# Patient Record
Sex: Female | Born: 1950 | Race: White | Hispanic: No | Marital: Married | Smoking: Former smoker
Health system: Southern US, Community
[De-identification: ages and names within clinical notes are randomized; demographics above are authoritative.]

## PROBLEM LIST (undated history)

## (undated) DIAGNOSIS — C439 Malignant melanoma of skin, unspecified: Secondary | ICD-10-CM

## (undated) DIAGNOSIS — I10 Essential (primary) hypertension: Secondary | ICD-10-CM

## (undated) DIAGNOSIS — E079 Disorder of thyroid, unspecified: Secondary | ICD-10-CM

## (undated) DIAGNOSIS — E785 Hyperlipidemia, unspecified: Secondary | ICD-10-CM

## (undated) HISTORY — DX: Hyperlipidemia, unspecified: E78.5

## (undated) HISTORY — DX: Essential (primary) hypertension: I10

## (undated) HISTORY — DX: Disorder of thyroid, unspecified: E07.9

## (undated) HISTORY — DX: Malignant melanoma of skin, unspecified: C43.9

## (undated) HISTORY — PX: SKIN LESION EXCISION: SHX2412

## (undated) HISTORY — PX: ABDOMINAL HYSTERECTOMY: SHX81

---

## 2006-11-02 ENCOUNTER — Ambulatory Visit: Payer: Self-pay | Admitting: Gastroenterology

## 2012-10-25 ENCOUNTER — Telehealth: Payer: Self-pay | Admitting: Nurse Practitioner

## 2012-10-25 MED ORDER — ATORVASTATIN CALCIUM 40 MG PO TABS: 40.0000 mg | ORAL_TABLET | Freq: Every day | ORAL | Status: DC

## 2012-10-25 MED ORDER — AMLODIPINE BESYLATE 5 MG PO TABS: 5.0000 mg | ORAL_TABLET | Freq: Every day | ORAL | Status: DC

## 2012-10-25 NOTE — Telephone Encounter (Signed)
rx sent in 

## 2012-10-25 NOTE — Telephone Encounter (Signed)
Lipiotr and norvasc come in Generic. RX sent to [facility]

## 2012-10-25 NOTE — Telephone Encounter (Signed)
Please advise 

## 2012-11-26 ENCOUNTER — Encounter: Payer: Self-pay | Admitting: Nurse Practitioner

## 2012-11-26 ENCOUNTER — Ambulatory Visit (INDEPENDENT_AMBULATORY_CARE_PROVIDER_SITE_OTHER): Payer: BC Managed Care – PPO | Admitting: Nurse Practitioner

## 2012-11-26 VITALS — BP 140/78 | HR 87 | Temp 98.0°F | Ht 64.5 in | Wt 157.0 lb

## 2012-11-26 DIAGNOSIS — E785 Hyperlipidemia, unspecified: Secondary | ICD-10-CM | POA: Insufficient documentation

## 2012-11-26 DIAGNOSIS — Z1211 Encounter for screening for malignant neoplasm of colon: Secondary | ICD-10-CM

## 2012-11-26 DIAGNOSIS — I10 Essential (primary) hypertension: Secondary | ICD-10-CM

## 2012-11-26 DIAGNOSIS — E039 Hypothyroidism, unspecified: Secondary | ICD-10-CM

## 2012-11-26 DIAGNOSIS — L989 Disorder of the skin and subcutaneous tissue, unspecified: Secondary | ICD-10-CM

## 2012-11-26 MED ORDER — BENAZEPRIL HCL 20 MG PO TABS: 20.0000 mg | ORAL_TABLET | Freq: Every day | ORAL | Status: DC

## 2012-11-26 MED ORDER — LEVOTHYROXINE SODIUM 88 MCG PO TABS: 88.0000 ug | ORAL_TABLET | Freq: Every day | ORAL | Status: DC

## 2012-11-26 NOTE — Patient Instructions (Signed)

## 2012-11-26 NOTE — Progress Notes (Signed)
Subjective:    Patient ID: Gabriela Gardner, female    DOB: 11-19-1950, 62 y.o.   MRN: [id number]  Hypertension This is a chronic problem. The current episode started more than 1 year ago. The problem is unchanged. The problem is controlled (120'2 systolic when takes at home.). Pertinent negatives include no blurred vision, chest pain, headaches, malaise/fatigue, peripheral edema or shortness of breath. There are no associated agents to hypertension. Risk factors for coronary artery disease include dyslipidemia and post-menopausal state. Past treatments include ACE inhibitors and calcium channel blockers. The current treatment provides moderate improvement. Compliance problems include diet and exercise.  Hypertensive end-organ damage includes a thyroid problem.  Hyperlipidemia This is a chronic problem. The current episode started more than 1 year ago. The problem is controlled. Recent lipid tests were reviewed and are normal. Exacerbating diseases include hypothyroidism. She has no history of diabetes. Pertinent negatives include no chest pain or shortness of breath. Current antihyperlipidemic treatment includes statins. The current treatment provides significant improvement of lipids. There are no compliance problems.  Risk factors for coronary artery disease include hypertension and post-menopausal.  Thyroid Problem Presents for follow-up visit. Patient reports no anxiety, constipation, diaphoresis, diarrhea, dry skin, hair loss, heat intolerance, tremors, weight gain or weight loss. The symptoms have been stable. Her past medical history is significant for hyperlipidemia. There is no history of diabetes.      Review of Systems  Constitutional: Negative for weight loss, weight gain, malaise/fatigue and diaphoresis.  Eyes: Negative for blurred vision.  Respiratory: Negative for shortness of breath.   Cardiovascular: Negative for chest pain.  Gastrointestinal: Negative for diarrhea and constipation.   Endocrine: Negative for heat intolerance.  Neurological: Negative for tremors and headaches.  All other systems reviewed and are negative.       Objective:   Physical Exam  Constitutional: She is oriented to person, place, and time. She appears well-developed and well-nourished.  HENT:  Nose: Nose normal.  Mouth/Throat: Oropharynx is clear and moist.  Eyes: EOM are normal.  Neck: Trachea normal, normal range of motion and full passive range of motion without pain. Neck supple. No JVD present. Carotid bruit is not present. No thyromegaly present.  Cardiovascular: Normal rate, regular rhythm, normal heart sounds and intact distal pulses.  Exam reveals no gallop and no friction rub.   No murmur heard. Pulmonary/Chest: Effort normal and breath sounds normal.  Abdominal: Soft. Bowel sounds are normal. She exhibits no distension and no mass. There is no tenderness.  Musculoskeletal: Normal range of motion.  Lymphadenopathy:    She has no cervical adenopathy.  Neurological: She is alert and oriented to person, place, and time. She has normal reflexes.  Skin: Skin is warm and dry.  Psychiatric: She has a normal mood and affect. Her behavior is normal. Judgment and thought content normal.   BP 140/78  Pulse 87  Temp(Src) 98 F (36.7 C) (Oral)  Ht 5' 4.5" (1.638 m)  Wt 157 lb (71.215 kg)  BMI 26.54 kg/m2        Assessment & Plan:   1. Hypertension   2. Hyperlipidemia   3. Hypothyroidism    Orders Placed This Encounter  Procedures  . COMPLETE METABOLIC PANEL WITH GFR  . NMR Lipoprofile with Lipids  . Thyroid Panel With TSH     Medication List       These changes are accurate as of: 11/26/2012  3:36 PM. If you have any questions, ask your nurse or doctor.  TAKE these medications       amLODipine 5 MG tablet  Commonly known as:  NORVASC  Take 1 tablet (5 mg total) by mouth daily.     atorvastatin 40 MG tablet  Commonly known as:  LIPITOR  Take 1 tablet  (40 mg total) by mouth daily.     benazepril 20 MG tablet  Commonly known as:  LOTENSIN  Take 1 tablet (20 mg total) by mouth daily.     levothyroxine 88 MCG tablet  Commonly known as:  SYNTHROID, LEVOTHROID  Take 1 tablet (88 mcg total) by mouth daily before breakfast.       Continue all meds  Labs pending Diet and exercise encouraged Referral for colonoscopy Schedule for mammogram  Mary-Margaret Daphine Deutscher, FNP

## 2012-11-27 LAB — COMPLETE METABOLIC PANEL WITH GFR
CO2: 27 mEq/L (ref 19–32)
Creat: 0.97 mg/dL (ref 0.50–1.10)
GFR, Est African American: 72 mL/min
GFR, Est Non African American: 62 mL/min
Glucose, Bld: 99 mg/dL (ref 70–99)
Sodium: 140 mEq/L (ref 135–145)
Total Bilirubin: 0.6 mg/dL (ref 0.3–1.2)
Total Protein: 7.3 g/dL (ref 6.0–8.3)

## 2012-11-27 LAB — THYROID PANEL WITH TSH: T4, Total: 6.3 ug/dL (ref 5.0–12.5)

## 2012-11-30 ENCOUNTER — Encounter: Payer: Self-pay | Admitting: Gastroenterology

## 2012-11-30 LAB — NMR LIPOPROFILE WITH LIPIDS
HDL Size: 9.2 nm (ref >=9.2)
HDL-C: 60 mg/dL (ref >=40)
LDL Size: 20.9 nm (ref >20.5)
Large HDL-P: 8.9 umol/L (ref >=4.8)

## 2012-12-02 ENCOUNTER — Encounter: Payer: Self-pay | Admitting: Nurse Practitioner

## 2013-01-04 ENCOUNTER — Ambulatory Visit (AMBULATORY_SURGERY_CENTER): Payer: BC Managed Care – PPO

## 2013-01-04 ENCOUNTER — Encounter: Payer: Self-pay | Admitting: Gastroenterology

## 2013-01-04 VITALS — Ht 64.5 in | Wt 164.0 lb

## 2013-01-04 DIAGNOSIS — Z1211 Encounter for screening for malignant neoplasm of colon: Secondary | ICD-10-CM

## 2013-01-04 MED ORDER — NA SULFATE-K SULFATE-MG SULF 17.5-3.13-1.6 GM/177ML PO SOLN: 1.0000 | Freq: Once | ORAL | Status: DC

## 2013-01-18 ENCOUNTER — Ambulatory Visit (AMBULATORY_SURGERY_CENTER): Payer: BC Managed Care – PPO | Admitting: Gastroenterology

## 2013-01-18 ENCOUNTER — Encounter: Payer: Self-pay | Admitting: Gastroenterology

## 2013-01-18 VITALS — BP 113/68 | HR 59 | Temp 98.1°F | Resp 19 | Ht 64.0 in | Wt 164.0 lb

## 2013-01-18 DIAGNOSIS — Z1211 Encounter for screening for malignant neoplasm of colon: Secondary | ICD-10-CM

## 2013-01-18 MED ORDER — SODIUM CHLORIDE 0.9 % IV SOLN: 500.0000 mL | INTRAVENOUS | Status: DC

## 2013-01-18 NOTE — Patient Instructions (Signed)
YOU HAD AN ENDOSCOPIC PROCEDURE TODAY AT THE [facility]: Refer to the procedure report that was given to you for any specific questions about what was found during the examination.  If the procedure report does not answer your questions, please call your gastroenterologist to clarify.  If you requested that your care partner not be given the details of your procedure findings, then the procedure report has been included in a sealed envelope for you to review at your convenience later.  YOU SHOULD EXPECT: Some feelings of bloating in the abdomen. Passage of more gas than usual.  Walking can help get rid of the air that was put into your GI tract during the procedure and reduce the bloating. If you had a lower endoscopy (such as a colonoscopy or flexible sigmoidoscopy) you may notice spotting of blood in your stool or on the toilet paper. If you underwent a bowel prep for your procedure, then you may not have a normal bowel movement for a few days.  DIET: Your first meal following the procedure should be a light meal and then it is ok to progress to your normal diet.  A half-sandwich or bowl of soup is an example of a good first meal.  Heavy or fried foods are harder to digest and may make you feel nauseous or bloated.  Likewise meals heavy in dairy and vegetables can cause extra gas to form and this can also increase the bloating.  Drink plenty of fluids but you should avoid alcoholic beverages for 24 hours.  ACTIVITY: Your care partner should take you home directly after the procedure.  You should plan to take it easy, moving slowly for the rest of the day.  You can resume normal activity the day after the procedure however you should NOT DRIVE or use heavy machinery for 24 hours (because of the sedation medicines used during the test).    SYMPTOMS TO REPORT IMMEDIATELY: A gastroenterologist can be reached at any hour.  During normal business hours, 8:30 AM to 5:00 PM Monday through Friday,  call [phone].  After hours and on weekends, please call the GI answering service at [phone] who will take a message and have the physician on call contact you.   Following lower endoscopy (colonoscopy or flexible sigmoidoscopy):  Excessive amounts of blood in the stool  Significant tenderness or worsening of abdominal pains  Swelling of the abdomen that is new, acute  Fever of 100F or higher    FOLLOW UP: If any biopsies were taken you will be contacted by phone or by letter within the next 1-3 weeks.  Call your gastroenterologist if you have not heard about the biopsies in 3 weeks.  Our staff will call the home number listed on your records the next business day following your procedure to check on you and address any questions or concerns that you may have at that time regarding the information given to you following your procedure. This is a courtesy call and so if there is no answer at the home number and we have not heard from you through the emergency physician on call, we will assume that you have returned to your regular daily activities without incident.  SIGNATURES/CONFIDENTIALITY: You and/or your care partner have signed paperwork which will be entered into your electronic medical record.  These signatures attest to the fact that that the information above on your After Visit Summary has been reviewed and is understood.  Full responsibility of the confidentiality   of this discharge information lies with you and/or your care-partner.     

## 2013-01-18 NOTE — Progress Notes (Signed)
Patient did not have preoperative order for IV antibiotic SSI prophylaxis. (G8918)  Patient did not experience any of the following events: a burn prior to discharge; a fall within the facility; wrong site/side/patient/procedure/implant event; or a hospital transfer or hospital admission upon discharge from the facility. (G8907)  

## 2013-01-18 NOTE — Op Note (Signed)
[facility] [address] [place], [address]   COLONOSCOPY PROCEDURE REPORT  PATIENT: Gabriela, Gardner  MR#: [id number] BIRTHDATE: Dec 12, 1950 , 62  yrs. old GENDER: Female ENDOSCOPIST: Louis Meckel, MD REFERRED XB:JYNWGN Christell Constant, M.D. PROCEDURE DATE:  01/18/2013 PROCEDURE:   Colonoscopy, diagnostic ASA CLASS:   Class II INDICATIONS:average risk screening. MEDICATIONS: MAC sedation, administered by CRNA and propofol (Diprivan) 300mg  IV  DESCRIPTION OF PROCEDURE:   After the risks benefits and alternatives of the procedure were thoroughly explained, informed consent was obtained.  A digital rectal exam revealed no abnormalities of the rectum.   The LB [id number]  endoscope was introduced through the anus and advanced to the cecum, which was identified by both the appendix and ileocecal valve. No adverse events experienced.   The quality of the prep was excellent using Suprep  The instrument was then slowly withdrawn as the colon was fully examined.      COLON FINDINGS: A normal appearing cecum, ileocecal valve, and appendiceal orifice were identified.  The ascending, hepatic flexure, transverse, splenic flexure, descending, sigmoid colon and rectum appeared unremarkable.  No polyps or cancers were seen. Retroflexed views revealed no abnormalities. The time to cecum=4 minutes 0 seconds.  Withdrawal time=8 minutes 26 seconds.  The scope was withdrawn and the procedure completed. COMPLICATIONS: There were no complications.  ENDOSCOPIC IMPRESSION: Normal colon  RECOMMENDATIONS: Continue current colorectal screening recommendations for "routine risk" patients with a repeat colonoscopy in 10 years.   eSigned:  Louis Meckel, MD 01/18/2013 11:38 AM   cc:

## 2013-01-19 ENCOUNTER — Telehealth: Payer: Self-pay

## 2013-01-19 NOTE — Telephone Encounter (Signed)
  Follow up Call-  Call back number 01/18/2013  Post procedure Call Back phone  # [phone]  Permission to leave phone message Yes     Patient questions:  Do you have a fever, pain , or abdominal swelling? no Pain Score  0 *  Have you tolerated food without any problems? yes  Have you been able to return to your normal activities? yes  Do you have any questions about your discharge instructions: Diet   no Medications  no Follow up visit  no  Do you have questions or concerns about your Care? no  Actions: * If pain score is 4 or above: No action needed, pain <4.

## 2013-02-18 ENCOUNTER — Other Ambulatory Visit: Payer: Self-pay

## 2013-02-18 MED ORDER — ATORVASTATIN CALCIUM 40 MG PO TABS: 40.0000 mg | ORAL_TABLET | Freq: Every day | ORAL | Status: DC

## 2013-02-18 MED ORDER — AMLODIPINE BESYLATE 5 MG PO TABS: 5.0000 mg | ORAL_TABLET | Freq: Every day | ORAL | Status: DC

## 2013-05-05 ENCOUNTER — Other Ambulatory Visit: Payer: Self-pay

## 2013-05-17 ENCOUNTER — Other Ambulatory Visit: Payer: Self-pay | Admitting: Family Medicine

## 2013-05-18 ENCOUNTER — Other Ambulatory Visit: Payer: Self-pay | Admitting: *Deleted

## 2013-05-18 DIAGNOSIS — E039 Hypothyroidism, unspecified: Secondary | ICD-10-CM

## 2013-05-18 DIAGNOSIS — I10 Essential (primary) hypertension: Secondary | ICD-10-CM

## 2013-05-18 MED ORDER — BENAZEPRIL HCL 20 MG PO TABS: 20.0000 mg | ORAL_TABLET | Freq: Every day | ORAL | Status: DC

## 2013-05-18 MED ORDER — ATORVASTATIN CALCIUM 40 MG PO TABS: 40.0000 mg | ORAL_TABLET | Freq: Every day | ORAL | Status: DC

## 2013-05-18 MED ORDER — LEVOTHYROXINE SODIUM 88 MCG PO TABS: 88.0000 ug | ORAL_TABLET | Freq: Every day | ORAL | Status: DC

## 2013-05-18 NOTE — Telephone Encounter (Signed)
LAST LABS 11/26/12. NTBS

## 2013-06-20 ENCOUNTER — Other Ambulatory Visit: Payer: Self-pay | Admitting: Nurse Practitioner

## 2013-07-19 ENCOUNTER — Other Ambulatory Visit: Payer: Self-pay | Admitting: Nurse Practitioner

## 2013-08-18 ENCOUNTER — Other Ambulatory Visit: Payer: Self-pay | Admitting: Nurse Practitioner

## 2013-08-19 NOTE — Telephone Encounter (Signed)
Last refill without being seen 

## 2013-08-19 NOTE — Telephone Encounter (Signed)
LAST SEEN 05/14 

## 2013-09-14 ENCOUNTER — Other Ambulatory Visit: Payer: Self-pay | Admitting: Nurse Practitioner

## 2013-10-18 ENCOUNTER — Other Ambulatory Visit: Payer: Self-pay | Admitting: Nurse Practitioner

## 2013-10-20 NOTE — Telephone Encounter (Signed)
Last seen and last lipid 11/26/12  MMM

## 2013-10-20 NOTE — Telephone Encounter (Signed)
Patient NTBS for follow up and lab work Cannot fill lipitor until have labs to check liver function- refilled blood pressure meds

## 2013-11-30 ENCOUNTER — Ambulatory Visit (INDEPENDENT_AMBULATORY_CARE_PROVIDER_SITE_OTHER): Payer: BC Managed Care – PPO | Admitting: Nurse Practitioner

## 2013-11-30 ENCOUNTER — Encounter: Payer: Self-pay | Admitting: Nurse Practitioner

## 2013-11-30 VITALS — BP 155/79 | HR 70 | Temp 97.8°F | Ht 64.0 in | Wt 161.2 lb

## 2013-11-30 DIAGNOSIS — G4700 Insomnia, unspecified: Secondary | ICD-10-CM

## 2013-11-30 DIAGNOSIS — I10 Essential (primary) hypertension: Secondary | ICD-10-CM

## 2013-11-30 DIAGNOSIS — E039 Hypothyroidism, unspecified: Secondary | ICD-10-CM

## 2013-11-30 DIAGNOSIS — E785 Hyperlipidemia, unspecified: Secondary | ICD-10-CM

## 2013-11-30 DIAGNOSIS — Z13820 Encounter for screening for osteoporosis: Secondary | ICD-10-CM

## 2013-11-30 MED ORDER — AMLODIPINE BESYLATE 5 MG PO TABS: ORAL_TABLET | ORAL | Status: DC

## 2013-11-30 MED ORDER — BENAZEPRIL HCL 20 MG PO TABS: ORAL_TABLET | ORAL | Status: DC

## 2013-11-30 MED ORDER — LEVOTHYROXINE SODIUM 88 MCG PO TABS: ORAL_TABLET | ORAL | Status: DC

## 2013-11-30 MED ORDER — ATORVASTATIN CALCIUM 40 MG PO TABS: ORAL_TABLET | ORAL | Status: DC

## 2013-11-30 MED ORDER — TRAZODONE HCL 50 MG PO TABS: 50.0000 mg | ORAL_TABLET | Freq: Every day | ORAL | Status: DC

## 2013-11-30 NOTE — Progress Notes (Signed)
Subjective:    Patient ID: Gabriela Gardner, female    DOB: 06/07/1951, 63 y.o.   MRN: [id number]  Patient here today for follow up of chronic medical problems.  Has had a melanoma removed form left upper arm.  * She is complaining today about having trouble falling asleep- she sleeps fine once she falls asleep but can sometimes take several hours to fall asleep.   Hypertension This is a chronic problem. The current episode started more than 1 year ago. The problem is unchanged. The problem is controlled (329'5 systolic when takes at home.). Pertinent negatives include no blurred vision, chest pain, headaches, malaise/fatigue, peripheral edema or shortness of breath. There are no associated agents to hypertension. Risk factors for coronary artery disease include dyslipidemia and post-menopausal state. Past treatments include ACE inhibitors and calcium channel blockers. The current treatment provides moderate improvement. Compliance problems include diet and exercise.  Hypertensive end-organ damage includes a thyroid problem.  Hyperlipidemia This is a chronic problem. The current episode started more than 1 year ago. The problem is controlled. Recent lipid tests were reviewed and are normal. Exacerbating diseases include hypothyroidism. She has no history of diabetes. Pertinent negatives include no chest pain or shortness of breath. Current antihyperlipidemic treatment includes statins. The current treatment provides significant improvement of lipids. There are no compliance problems.  Risk factors for coronary artery disease include hypertension and post-menopausal.  Thyroid Problem Presents for follow-up visit. Patient reports no anxiety, constipation, diaphoresis, diarrhea, dry skin, hair loss, heat intolerance, tremors, weight gain or weight loss. The symptoms have been stable. Her past medical history is significant for hyperlipidemia. There is no history of diabetes.      Review of Systems   Constitutional: Negative for weight loss, weight gain, malaise/fatigue and diaphoresis.  Eyes: Negative for blurred vision.  Respiratory: Negative for shortness of breath.   Cardiovascular: Negative for chest pain.  Gastrointestinal: Negative for diarrhea and constipation.  Endocrine: Negative for heat intolerance.  Neurological: Negative for tremors and headaches.  All other systems reviewed and are negative.      Objective:   Physical Exam  Constitutional: She is oriented to person, place, and time. She appears well-developed and well-nourished.  HENT:  Nose: Nose normal.  Mouth/Throat: Oropharynx is clear and moist.  Eyes: EOM are normal.  Neck: Trachea normal, normal range of motion and full passive range of motion without pain. Neck supple. No JVD present. Carotid bruit is not present. No thyromegaly present.  Cardiovascular: Normal rate, regular rhythm, normal heart sounds and intact distal pulses.  Exam reveals no gallop and no friction rub.   No murmur heard. Pulmonary/Chest: Effort normal and breath sounds normal.  Abdominal: Soft. Bowel sounds are normal. She exhibits no distension and no mass. There is no tenderness.  Musculoskeletal: Normal range of motion.  Lymphadenopathy:    She has no cervical adenopathy.  Neurological: She is alert and oriented to person, place, and time. She has normal reflexes.  Skin: Skin is warm and dry.  Psychiatric: She has a normal mood and affect. Her behavior is normal. Judgment and thought content normal.   BP 155/79  Pulse 70  Temp(Src) 97.8 F (36.6 C) (Oral)  Ht _0  (1.626 m)  Wt 161 lb 3.2 oz (73.12 kg)  BMI 27.66 kg/m2        Assessment & Plan:   1. Hypothyroidism   2. Hypertension   3. Hyperlipidemia   4. Insomnia    Orders Placed This Encounter  Procedures  .  CMP14+EGFR  . NMR, lipoprofile  . Thyroid Panel With TSH   Meds ordered this encounter  Medications  . amLODipine (NORVASC) 5 MG tablet    Sig: TAKE  ONE TABLET BY MOUTH ONCE DAILY (MUST BE SEEN BEFORE NEXT REFILL)    Dispense:  90 tablet    Refill:  1    Order Specific Question:  Supervising Provider    Answer:  Chipper Herb [[id number]]  . atorvastatin (LIPITOR) 40 MG tablet    Sig: TAKE ONE TABLET BY MOUTH ONCE DAILY    Dispense:  90 tablet    Refill:  1    Needs to be seen before next refill    Order Specific Question:  Supervising Provider    Answer:  Chipper Herb [[id number]]  . benazepril (LOTENSIN) 20 MG tablet    Sig: TAKE ONE TABLET BY MOUTH ONCE DAILY    Dispense:  90 tablet    Refill:  1    Order Specific Question:  Supervising Provider    Answer:  Chipper Herb [[id number]]  . levothyroxine (SYNTHROID, LEVOTHROID) 88 MCG tablet    Sig: TAKE ONE TABLET BY MOUTH ONCE DAILY BEFORE  BREAKFAST    Dispense:  90 tablet    Refill:  1    Order Specific Question:  Supervising Provider    Answer:  Chipper Herb [[id number]]  . traZODone (DESYREL) 50 MG tablet    Sig: Take 1 tablet (50 mg total) by mouth at bedtime.    Dispense:  30 tablet    Refill:  3    Order Specific Question:  Supervising Provider    Answer:  Chipper Herb [[id number]]   Patient to make appointment for mammogram Labs pending Health maintenance reviewed Diet and exercise encouraged Continue all meds Follow up  In 3 months   [place], FNP

## 2013-11-30 NOTE — Patient Instructions (Signed)

## 2013-12-01 ENCOUNTER — Telehealth: Payer: Self-pay | Admitting: Nurse Practitioner

## 2013-12-01 ENCOUNTER — Other Ambulatory Visit: Payer: Self-pay | Admitting: Nurse Practitioner

## 2013-12-01 LAB — NMR, LIPOPROFILE
CHOLESTEROL: 185 mg/dL (ref 100–199)
HDL Cholesterol by NMR: 68 mg/dL (ref >39)
HDL PARTICLE NUMBER: 46.8 umol/L (ref >=30.5)
LDL Particle Number: 1023 nmol/L — ABNORMAL HIGH (ref <1000)
LDL Size: 20.6 nm (ref >20.5)
LDLC SERPL CALC-MCNC: 90 mg/dL (ref 0–99)
LP-IR Score: 50 — ABNORMAL HIGH (ref <=45)
Small LDL Particle Number: 386 nmol/L (ref <=527)
Triglycerides by NMR: 137 mg/dL (ref 0–149)

## 2013-12-01 LAB — CMP14+EGFR
ALK PHOS: 80 IU/L (ref 39–117)
ALT: 38 IU/L — ABNORMAL HIGH (ref 0–32)
AST: 43 IU/L — AB (ref 0–40)
Albumin/Globulin Ratio: 2.1 (ref 1.1–2.5)
Albumin: 4.8 g/dL (ref 3.6–4.8)
BILIRUBIN TOTAL: 0.7 mg/dL (ref 0.0–1.2)
BUN / CREAT RATIO: 11 (ref 11–26)
BUN: 11 mg/dL (ref 8–27)
CO2: 26 mmol/L (ref 18–29)
CREATININE: 1 mg/dL (ref 0.57–1.00)
Calcium: 9.5 mg/dL (ref 8.7–10.3)
Chloride: 102 mmol/L (ref 97–108)
GFR calc non Af Amer: 60 mL/min/{1.73_m2} (ref >59)
GFR, EST AFRICAN AMERICAN: 69 mL/min/{1.73_m2} (ref >59)
GLOBULIN, TOTAL: 2.3 g/dL (ref 1.5–4.5)
Glucose: 90 mg/dL (ref 65–99)
Potassium: 4.5 mmol/L (ref 3.5–5.2)
SODIUM: 142 mmol/L (ref 134–144)
Total Protein: 7.1 g/dL (ref 6.0–8.5)

## 2013-12-01 LAB — THYROID PANEL WITH TSH
FREE THYROXINE INDEX: 1.8 (ref 1.2–4.9)
T3 Uptake Ratio: 25 % (ref 24–39)
T4 TOTAL: 7 ug/dL (ref 4.5–12.0)
TSH: 6 u[IU]/mL — ABNORMAL HIGH (ref 0.450–4.500)

## 2013-12-01 MED ORDER — LEVOTHYROXINE SODIUM 100 MCG PO TABS: 100.0000 ug | ORAL_TABLET | Freq: Every day | ORAL | Status: DC

## 2013-12-01 NOTE — Telephone Encounter (Signed)
Patient is aware of labs.  Make sure rx has been sent to pharmacy.  rs

## 2013-12-01 NOTE — Telephone Encounter (Signed)
Message copied by Cline Crock on Thu Dec 01, 2013  2:12 PM ------      Message from: Chevis Pretty      Created: Thu Dec 01, 2013  1:46 PM       Kidney and liver function stable      Cholesterol looks great      TSH elevated- levothyroxin dose increased- rx sent to pharmacy      Continue current meds- low fat diet and exercise and recheck in 3 months       ------

## 2014-02-22 ENCOUNTER — Ambulatory Visit (INDEPENDENT_AMBULATORY_CARE_PROVIDER_SITE_OTHER): Payer: BC Managed Care – PPO

## 2014-02-22 ENCOUNTER — Ambulatory Visit (INDEPENDENT_AMBULATORY_CARE_PROVIDER_SITE_OTHER): Payer: BC Managed Care – PPO | Admitting: Pharmacist

## 2014-02-22 ENCOUNTER — Encounter: Payer: Self-pay | Admitting: Pharmacist

## 2014-02-22 VITALS — BP 136/82 | HR 75 | Ht 64.25 in | Wt 161.0 lb

## 2014-02-22 DIAGNOSIS — M899 Disorder of bone, unspecified: Secondary | ICD-10-CM

## 2014-02-22 DIAGNOSIS — M949 Disorder of cartilage, unspecified: Secondary | ICD-10-CM

## 2014-02-22 DIAGNOSIS — E039 Hypothyroidism, unspecified: Secondary | ICD-10-CM

## 2014-02-22 DIAGNOSIS — Z13820 Encounter for screening for osteoporosis: Secondary | ICD-10-CM

## 2014-02-22 DIAGNOSIS — M1990 Unspecified osteoarthritis, unspecified site: Secondary | ICD-10-CM | POA: Insufficient documentation

## 2014-02-22 DIAGNOSIS — M8580 Other specified disorders of bone density and structure, unspecified site: Secondary | ICD-10-CM

## 2014-02-22 DIAGNOSIS — I10 Essential (primary) hypertension: Secondary | ICD-10-CM

## 2014-02-22 DIAGNOSIS — Z2911 Encounter for prophylactic immunotherapy for respiratory syncytial virus (RSV): Secondary | ICD-10-CM

## 2014-02-22 LAB — HM DEXA SCAN

## 2014-02-22 NOTE — Patient Instructions (Signed)

## 2014-02-22 NOTE — Progress Notes (Signed)
Patient ID: Gabriela Gardner, female   DOB: 20-Feb-1951, 63 y.o.   MRN: [id number]  [facility] Current Height: Height: 5' 4.25" (163.2 cm)      Max Lifetime Height:  5\' 5"  Current Weight: Weight: 161 lb (73.029 kg)       Ethnicity:Caucasian  BP: BP: 136/82 mmHg     HR:  Pulse Rate: 75      HPI: Does pt already have a diagnosis of:  Osteopenia?  Yes Osteoporosis?  No  Back Pain?  No       Kyphosis?  No Prior fracture?  No Med(s) for Osteoporosis/Osteopenia:  none Med(s) previously tried for Osteoporosis/Osteopenia:  none                                                             PMH: Age at menopause:  Surgical at 63yo Hysterectomy?  Yes Oophorectomy?  No HRT? No Steroid Use?  No Thyroid med?  Yes History of cancer?  Yes - uterine carcinoma insitu History of digestive disorders (ie Crohn's)?  No Current or previous eating disorders?  No Last Vitamin D Result:  46 (08/2011) Last GFR Result:  60 (11/30/2013)   FH/SH: Family history of osteoporosis?  Unknown - adopted Parent with history of hip fracture?  No - unknown / adopted Family history of breast cancer?  Unknown - adopted Exercise?  No Smoking?  No Alcohol?  No    Calcium Assessment Calcium Intake  # of servings/day  Calcium mg  Milk (8 oz) 1  x  300  = 300mg   Yogurt (4 oz) 0 x  200 = 0  Cheese (1 oz) 0.5 x  200 = 100mg   Other Calcium sources   250mg   Ca supplement 600mg  + MVI = 1100mg    Estimated calcium intake per day 1750mg     DEXA Results Date of Test T-Score for AP Spine L1-L4 T-Score for Total Left Hip T-Score for Total Right Hip  02/22/2014 -0.5 -0.8 -1.1  06/02/2005 -0.7 -1.0 -1.0             FRAX 10 year estimate: Total FX risk:  8.2%  (consider medication if >/= 20%) Hip FX risk:  0.7%  (consider medication if >/= 3%)  Assessment: Osteopenia - low FRAX estimate HTN - last visis with Ronnald Collum, NP BP was elevated but she was out of medications - today's BP at goal Hypothyroidism -  levothyroxine increased 11/2013 due to recheck today  Recommendations: 1.  Discussed BMD results and frracture risk 2.  recommend calcium 1200mg  daily through supplementation or diet.  3.  recommend weight bearing exercise - 30 minutes at least 4 days per week.   4.  Counseled and educated about fall risk and prevention. 5.  Due to recheck Thyroid panel today Orders Placed This Encounter  Procedures  . HM DEXA SCAN    This external order was created through the Results Console.  . Thyroid Panel With TSH   6.  Patient requests zostavax - given in office todya  Recheck DEXA:  2 years  Time spent counseling patient:  30 minutes  Cherre Robins, PharmD, CPP

## 2014-02-23 ENCOUNTER — Encounter: Payer: Self-pay | Admitting: Pharmacist

## 2014-02-23 LAB — THYROID PANEL WITH TSH
Free Thyroxine Index: 2.1 (ref 1.2–4.9)
T3 Uptake Ratio: 26 % (ref 24–39)
T4, Total: 8.2 ug/dL (ref 4.5–12.0)
TSH: 1.77 u[IU]/mL (ref 0.450–4.500)

## 2014-05-29 ENCOUNTER — Other Ambulatory Visit: Payer: Self-pay | Admitting: Nurse Practitioner

## 2014-05-29 NOTE — Telephone Encounter (Signed)
no more refills without being seen  

## 2014-05-29 NOTE — Telephone Encounter (Signed)
Please advise on refill.  Already advised to schedule appointment before additional refills.  No phone number in chart to contact for appointment.

## 2014-06-25 ENCOUNTER — Other Ambulatory Visit: Payer: Self-pay | Admitting: Nurse Practitioner

## 2014-07-25 ENCOUNTER — Other Ambulatory Visit: Payer: Self-pay | Admitting: Nurse Practitioner

## 2014-07-26 NOTE — Telephone Encounter (Signed)
Last seen 11/30/13 MMM  No upcoming appt.

## 2014-08-15 ENCOUNTER — Other Ambulatory Visit: Payer: Self-pay | Admitting: Nurse Practitioner

## 2014-08-15 MED ORDER — BENAZEPRIL HCL 20 MG PO TABS
20.0000 mg | ORAL_TABLET | Freq: Every day | ORAL | Status: DC
Start: 2014-08-15 — End: 2014-09-07

## 2014-08-15 MED ORDER — ATORVASTATIN CALCIUM 40 MG PO TABS: ORAL_TABLET | ORAL | Status: DC

## 2014-08-15 MED ORDER — LEVOTHYROXINE SODIUM 100 MCG PO TABS: 100.0000 ug | ORAL_TABLET | Freq: Every day | ORAL | Status: DC

## 2014-08-15 MED ORDER — AMLODIPINE BESYLATE 5 MG PO TABS
5.0000 mg | ORAL_TABLET | Freq: Every day | ORAL | Status: DC
Start: 2014-08-15 — End: 2014-09-07

## 2014-08-15 NOTE — Telephone Encounter (Signed)
Pt aware rx sent over to pharmacy. °

## 2014-09-07 ENCOUNTER — Encounter: Payer: Self-pay | Admitting: Nurse Practitioner

## 2014-09-07 ENCOUNTER — Ambulatory Visit (INDEPENDENT_AMBULATORY_CARE_PROVIDER_SITE_OTHER): Payer: BLUE CROSS/BLUE SHIELD | Admitting: Nurse Practitioner

## 2014-09-07 VITALS — BP 158/92 | HR 79 | Temp 97.2°F | Ht 64.0 in | Wt 165.0 lb

## 2014-09-07 DIAGNOSIS — E785 Hyperlipidemia, unspecified: Secondary | ICD-10-CM | POA: Diagnosis not present

## 2014-09-07 DIAGNOSIS — Z23 Encounter for immunization: Secondary | ICD-10-CM

## 2014-09-07 DIAGNOSIS — E039 Hypothyroidism, unspecified: Secondary | ICD-10-CM

## 2014-09-07 DIAGNOSIS — I10 Essential (primary) hypertension: Secondary | ICD-10-CM | POA: Diagnosis not present

## 2014-09-07 DIAGNOSIS — G4700 Insomnia, unspecified: Secondary | ICD-10-CM | POA: Diagnosis not present

## 2014-09-07 MED ORDER — BENAZEPRIL HCL 40 MG PO TABS: 40.0000 mg | ORAL_TABLET | Freq: Every day | ORAL | Status: DC

## 2014-09-07 MED ORDER — ZOLPIDEM TARTRATE 5 MG PO TABS: 5.0000 mg | ORAL_TABLET | Freq: Every evening | ORAL | Status: DC | PRN

## 2014-09-07 MED ORDER — AMLODIPINE BESYLATE 5 MG PO TABS: 5.0000 mg | ORAL_TABLET | Freq: Every day | ORAL | Status: DC

## 2014-09-07 MED ORDER — ATORVASTATIN CALCIUM 40 MG PO TABS: ORAL_TABLET | ORAL | Status: DC

## 2014-09-07 MED ORDER — LEVOTHYROXINE SODIUM 100 MCG PO TABS: 100.0000 ug | ORAL_TABLET | Freq: Every day | ORAL | Status: DC

## 2014-09-07 NOTE — Progress Notes (Signed)
Subjective:    Patient ID: Gabriela Gardner, female    DOB: 1950/10/03, 64 y.o.   MRN: [id number]  Patient here today for follow up of chronic medical problems.  She reports continued trouble sleeping.   Gastrophageal Reflux She complains of coughing and heartburn. She reports no abdominal pain, no chest pain, no choking, no hoarse voice or no sore throat. This is a new problem. The current episode started more than 1 month ago. The problem occurs frequently. The problem has been gradually worsening (Never used to experience this, lately experiences 3-4 times per week. ). The heartburn duration is several minutes (Relieved within 5 minutes of taking OTC antacid.). Heartburn location: Mainly in throat.  The heartburn is of moderate intensity. The heartburn does not wake her from sleep. The heartburn does not limit her activity. The heartburn doesn't change with position. Nothing aggravates the symptoms. Pertinent negatives include no weight loss. Risk factors include caffeine use. She has tried an antacid for the symptoms. The treatment provided significant relief.  Hypertension This is a chronic problem. The current episode started more than 1 year ago. The problem has been waxing and waning since onset. Pertinent negatives include no chest pain, headaches or shortness of breath. Agents associated with hypertension include thyroid hormones. Risk factors for coronary artery disease include dyslipidemia, post-menopausal state and sedentary lifestyle. Past treatments include ACE inhibitors and calcium channel blockers. The current treatment provides moderate improvement. Compliance problems include exercise and diet.  Hypertensive end-organ damage includes a thyroid problem.  Hyperlipidemia This is a chronic problem. The current episode started more than 1 year ago. Exacerbating diseases include hypothyroidism. Pertinent negatives include no chest pain or shortness of breath. Current antihyperlipidemic treatment  includes statins. Compliance problems include adherence to diet and adherence to exercise.  Risk factors for coronary artery disease include dyslipidemia, hypertension, obesity, post-menopausal and a sedentary lifestyle.  Thyroid Problem Patient reports no constipation, diaphoresis, diarrhea, heat intolerance, hoarse voice, tremors or weight loss. Past treatments include levothyroxine. Her past medical history is significant for hyperlipidemia, obesity and osteopenia. Risk factors include family history of hypothyroidism.      Review of Systems  Constitutional: Negative for weight loss and diaphoresis.  HENT: Negative for hoarse voice and sore throat.   Respiratory: Positive for cough. Negative for choking, chest tightness and shortness of breath.   Cardiovascular: Negative for chest pain and leg swelling.  Gastrointestinal: Positive for heartburn. Negative for abdominal pain, diarrhea and constipation.  Endocrine: Negative for heat intolerance.  Neurological: Negative for tremors, weakness and headaches.  All other systems reviewed and are negative.      Objective:   Physical Exam  Constitutional: She is oriented to person, place, and time. She appears well-developed and well-nourished.  HENT:  Nose: Nose normal.  Mouth/Throat: Oropharynx is clear and moist.  Eyes: EOM are normal.  Neck: Trachea normal, normal range of motion and full passive range of motion without pain. Neck supple. No JVD present. Carotid bruit is not present. No thyromegaly present.  Cardiovascular: Normal rate, regular rhythm, normal heart sounds and intact distal pulses.  Exam reveals no gallop and no friction rub.   No murmur heard. Pulmonary/Chest: Effort normal and breath sounds normal.  Abdominal: Soft. Bowel sounds are normal. She exhibits no distension and no mass. There is no tenderness.  Musculoskeletal: Normal range of motion.  Lymphadenopathy:    She has no cervical adenopathy.  Neurological: She is  alert and oriented to person, place, and time. She has  normal reflexes.  Skin: Skin is warm and dry.  Psychiatric: She has a normal mood and affect. Her behavior is normal. Judgment and thought content normal.   BP 158/92 mmHg  Pulse 79  Temp(Src) 97.2 F (36.2 C) (Oral)  Ht 5' 4"  (1.626 m)  Wt 165 lb (74.844 kg)  BMI 28.31 kg/m2         Assessment & Plan:   1. Essential hypertension Do not add salt to diet Increased benazepril form 20 mg daily to 29m daily - benazepril (LOTENSIN) 40 MG tablet; Take 1 tablet (40 mg total) by mouth daily.  Dispense: 90 tablet; Refill: 3 - amLODipine (NORVASC) 5 MG tablet; Take 1 tablet (5 mg total) by mouth daily.  Dispense: 90 tablet; Refill: 0 - CMP14+EGFR  2. Hypothyroidism, unspecified hypothyroidism type - levothyroxine (SYNTHROID, LEVOTHROID) 100 MCG tablet; Take 1 tablet (100 mcg total) by mouth daily.  Dispense: 90 tablet; Refill: 0 - Thyroid Panel With TSH  3. Insomnia Bedtime ritual - zolpidem (AMBIEN) 5 MG tablet; Take 1 tablet (5 mg total) by mouth at bedtime as needed for sleep.  Dispense: 30 tablet; Refill: 1  4. Hyperlipidemia Low fat diet - atorvastatin (LIPITOR) 40 MG tablet; TAKE ONE TABLET BY MOUTH ONCE DAILY  Dispense: 90 tablet; Refill: 0 - NMR, lipoprofile   Flu shot today Labs pending Health maintenance reviewed Diet and exercise encouraged Continue all meds Follow up  In 3 month   [place] FNP

## 2014-09-07 NOTE — Patient Instructions (Signed)
Exercise to Stay Healthy Exercise helps you become and stay healthy. EXERCISE IDEAS AND TIPS Choose exercises that:  You enjoy.  Fit into your day. You do not need to exercise really hard to be healthy. You can do exercises at a slow or medium level and stay healthy. You can:  Stretch before and after working out.  Try yoga, Pilates, or tai chi.  Lift weights.  Walk fast, swim, jog, run, climb stairs, bicycle, dance, or rollerskate.  Take aerobic classes. Exercises that burn about 150 calories:  Running 1  miles in 15 minutes.  Playing volleyball for 45 to 60 minutes.  Washing and waxing a car for 45 to 60 minutes.  Playing touch football for 45 minutes.  Walking 1  miles in 35 minutes.  Pushing a stroller 1  miles in 30 minutes.  Playing basketball for 30 minutes.  Raking leaves for 30 minutes.  Bicycling 5 miles in 30 minutes.  Walking 2 miles in 30 minutes.  Dancing for 30 minutes.  Shoveling snow for 15 minutes.  Swimming laps for 20 minutes.  Walking up stairs for 15 minutes.  Bicycling 4 miles in 15 minutes.  Gardening for 30 to 45 minutes.  Jumping rope for 15 minutes.  Washing windows or floors for 45 to 60 minutes. Document Released: 07/19/2010 Document Revised: 09/08/2011 Document Reviewed: 07/19/2010 [employer] Patient Information 2015 [employer]. This information is not intended to replace advice given to you by your health care provider. Make sure you discuss any questions you have with your health care provider.  

## 2014-09-08 LAB — CMP14+EGFR
ALK PHOS: 86 IU/L (ref 39–117)
ALT: 40 IU/L — AB (ref 0–32)
AST: 27 IU/L (ref 0–40)
Albumin/Globulin Ratio: 1.6 (ref 1.1–2.5)
Albumin: 4.6 g/dL (ref 3.6–4.8)
BILIRUBIN TOTAL: 0.4 mg/dL (ref 0.0–1.2)
BUN / CREAT RATIO: 14 (ref 11–26)
BUN: 12 mg/dL (ref 8–27)
CALCIUM: 9.8 mg/dL (ref 8.7–10.3)
CO2: 24 mmol/L (ref 18–29)
CREATININE: 0.83 mg/dL (ref 0.57–1.00)
Chloride: 102 mmol/L (ref 97–108)
GFR calc non Af Amer: 75 mL/min/{1.73_m2} (ref >59)
GFR, EST AFRICAN AMERICAN: 86 mL/min/{1.73_m2} (ref >59)
GLOBULIN, TOTAL: 2.8 g/dL (ref 1.5–4.5)
GLUCOSE: 111 mg/dL — AB (ref 65–99)
POTASSIUM: 4.7 mmol/L (ref 3.5–5.2)
SODIUM: 140 mmol/L (ref 134–144)
Total Protein: 7.4 g/dL (ref 6.0–8.5)

## 2014-09-08 LAB — NMR, LIPOPROFILE
CHOLESTEROL: 183 mg/dL (ref 100–199)
HDL Cholesterol by NMR: 63 mg/dL (ref >39)
HDL PARTICLE NUMBER: 46 umol/L (ref >=30.5)
LDL PARTICLE NUMBER: 903 nmol/L (ref <1000)
LDL Size: 20.7 nm (ref >20.5)
LDL-C: 84 mg/dL (ref 0–99)
LP-IR Score: 56 — ABNORMAL HIGH (ref <=45)
Small LDL Particle Number: 589 nmol/L — ABNORMAL HIGH (ref <=527)
Triglycerides by NMR: 182 mg/dL — ABNORMAL HIGH (ref 0–149)

## 2014-09-08 LAB — THYROID PANEL WITH TSH
Free Thyroxine Index: 2.4 (ref 1.2–4.9)
T3 Uptake Ratio: 27 % (ref 24–39)
T4 TOTAL: 9 ug/dL (ref 4.5–12.0)
TSH: 0.815 u[IU]/mL (ref 0.450–4.500)

## 2014-12-14 ENCOUNTER — Other Ambulatory Visit: Payer: Self-pay

## 2014-12-14 DIAGNOSIS — E039 Hypothyroidism, unspecified: Secondary | ICD-10-CM

## 2014-12-14 DIAGNOSIS — E785 Hyperlipidemia, unspecified: Secondary | ICD-10-CM

## 2014-12-14 DIAGNOSIS — I10 Essential (primary) hypertension: Secondary | ICD-10-CM

## 2014-12-14 MED ORDER — BENAZEPRIL HCL 40 MG PO TABS: 40.0000 mg | ORAL_TABLET | Freq: Every day | ORAL | Status: DC

## 2014-12-14 MED ORDER — LEVOTHYROXINE SODIUM 100 MCG PO TABS: 100.0000 ug | ORAL_TABLET | Freq: Every day | ORAL | Status: DC

## 2014-12-14 MED ORDER — AMLODIPINE BESYLATE 5 MG PO TABS: 5.0000 mg | ORAL_TABLET | Freq: Every day | ORAL | Status: DC

## 2014-12-14 MED ORDER — ATORVASTATIN CALCIUM 40 MG PO TABS: ORAL_TABLET | ORAL | Status: DC

## 2014-12-25 ENCOUNTER — Other Ambulatory Visit: Payer: Self-pay

## 2014-12-27 ENCOUNTER — Ambulatory Visit (INDEPENDENT_AMBULATORY_CARE_PROVIDER_SITE_OTHER): Payer: Medicare Other | Admitting: Nurse Practitioner

## 2014-12-27 ENCOUNTER — Encounter: Payer: Self-pay | Admitting: Nurse Practitioner

## 2014-12-27 VITALS — BP 135/80 | HR 71 | Temp 97.6°F | Ht 64.0 in | Wt 163.6 lb

## 2014-12-27 DIAGNOSIS — I10 Essential (primary) hypertension: Secondary | ICD-10-CM | POA: Diagnosis not present

## 2014-12-27 DIAGNOSIS — E785 Hyperlipidemia, unspecified: Secondary | ICD-10-CM | POA: Diagnosis not present

## 2014-12-27 DIAGNOSIS — G4700 Insomnia, unspecified: Secondary | ICD-10-CM

## 2014-12-27 DIAGNOSIS — E039 Hypothyroidism, unspecified: Secondary | ICD-10-CM

## 2014-12-27 DIAGNOSIS — Z23 Encounter for immunization: Secondary | ICD-10-CM | POA: Diagnosis not present

## 2014-12-27 DIAGNOSIS — M8580 Other specified disorders of bone density and structure, unspecified site: Secondary | ICD-10-CM

## 2014-12-27 MED ORDER — LEVOTHYROXINE SODIUM 100 MCG PO TABS: 100.0000 ug | ORAL_TABLET | Freq: Every day | ORAL | Status: DC

## 2014-12-27 MED ORDER — BENAZEPRIL HCL 40 MG PO TABS: 40.0000 mg | ORAL_TABLET | Freq: Every day | ORAL | Status: DC

## 2014-12-27 MED ORDER — AMLODIPINE BESYLATE 5 MG PO TABS: 5.0000 mg | ORAL_TABLET | Freq: Every day | ORAL | Status: DC

## 2014-12-27 MED ORDER — ATORVASTATIN CALCIUM 40 MG PO TABS: ORAL_TABLET | ORAL | Status: DC

## 2014-12-27 NOTE — Progress Notes (Signed)
Subjective:    Patient ID: Gabriela Gardner, female    DOB: 12-11-1950, 64 y.o.   MRN: [id number]  Patient here today for follow up of chronic medical problems.  She reports continued trouble sleeping.   Hypertension This is a chronic problem. The current episode started more than 1 year ago. The problem has been waxing and waning since onset. Pertinent negatives include no chest pain, headaches or shortness of breath. Agents associated with hypertension include thyroid hormones. Risk factors for coronary artery disease include dyslipidemia, post-menopausal state and sedentary lifestyle. Past treatments include ACE inhibitors and calcium channel blockers. The current treatment provides moderate improvement. Compliance problems include exercise and diet.  Hypertensive end-organ damage includes a thyroid problem.  Gastrophageal Reflux She complains of coughing and heartburn. She reports no abdominal pain, no chest pain, no choking, no hoarse voice or no sore throat. This is a new problem. The current episode started more than 1 month ago. The problem occurs frequently. The problem has been gradually worsening (Never used to experience this, lately experiences 3-4 times per week. ). The heartburn duration is several minutes (Relieved within 5 minutes of taking OTC antacid.). Heartburn location: Mainly in throat.  The heartburn is of moderate intensity. The heartburn does not wake her from sleep. The heartburn does not limit her activity. The heartburn doesn't change with position. Nothing aggravates the symptoms. Pertinent negatives include no weight loss. Risk factors include caffeine use. She has tried an antacid for the symptoms. The treatment provided significant relief.  Hyperlipidemia This is a chronic problem. The current episode started more than 1 year ago. Exacerbating diseases include hypothyroidism. Pertinent negatives include no chest pain or shortness of breath. Current antihyperlipidemic treatment  includes statins. Compliance problems include adherence to diet and adherence to exercise.  Risk factors for coronary artery disease include dyslipidemia, hypertension, obesity, post-menopausal and a sedentary lifestyle.  Thyroid Problem Patient reports no constipation, diaphoresis, diarrhea, heat intolerance, hoarse voice, tremors or weight loss. Past treatments include levothyroxine. Her past medical history is significant for hyperlipidemia, obesity and osteopenia. Risk factors include family history of hypothyroidism.  insomnia ambien nightly- helps her rest without side effects    Review of Systems  Constitutional: Negative for weight loss and diaphoresis.  HENT: Negative for hoarse voice and sore throat.   Respiratory: Positive for cough. Negative for choking, chest tightness and shortness of breath.   Cardiovascular: Negative for chest pain and leg swelling.  Gastrointestinal: Positive for heartburn. Negative for abdominal pain, diarrhea and constipation.  Endocrine: Negative for heat intolerance.  Neurological: Negative for tremors, weakness and headaches.  All other systems reviewed and are negative.      Objective:   Physical Exam  Constitutional: She is oriented to person, place, and time. She appears well-developed and well-nourished.  HENT:  Nose: Nose normal.  Mouth/Throat: Oropharynx is clear and moist.  Eyes: EOM are normal.  Neck: Trachea normal, normal range of motion and full passive range of motion without pain. Neck supple. No JVD present. Carotid bruit is not present. No thyromegaly present.  Cardiovascular: Normal rate, regular rhythm, normal heart sounds and intact distal pulses.  Exam reveals no gallop and no friction rub.   No murmur heard. Pulmonary/Chest: Effort normal and breath sounds normal.  Abdominal: Soft. Bowel sounds are normal. She exhibits no distension and no mass. There is no tenderness.  Musculoskeletal: Normal range of motion.   Lymphadenopathy:    She has no cervical adenopathy.  Neurological: She is alert and  oriented to person, place, and time. She has normal reflexes.  Skin: Skin is warm and dry.  Psychiatric: She has a normal mood and affect. Her behavior is normal. Judgment and thought content normal.   BP 135/80 mmHg  Pulse 71  Temp(Src) 97.6 F (36.4 C) (Oral)  Ht 5' 4"  (1.626 m)  Wt 163 lb 9.6 oz (74.208 kg)  BMI 28.07 kg/m2       Assessment & Plan:   /1. Essential hypertension Do not add salt to diet - benazepril (LOTENSIN) 40 MG tablet; Take 1 tablet (40 mg total) by mouth daily.  Dispense: 90 tablet; Refill: 0 - amLODipine (NORVASC) 5 MG tablet; Take 1 tablet (5 mg total) by mouth daily.  Dispense: 90 tablet; Refill: 0 - CMP14+EGFR  2. Hypothyroidism, unspecified hypothyroidism type - levothyroxine (SYNTHROID, LEVOTHROID) 100 MCG tablet; Take 1 tablet (100 mcg total) by mouth daily.  Dispense: 90 tablet; Refill: 2  3. Osteopenia Weight bearing exercises  4. Insomnia Bedtime ritual  5. Hyperlipidemia Low fat diet - atorvastatin (LIPITOR) 40 MG tablet; TAKE ONE TABLET BY MOUTH ONCE DAILY  Dispense: 90 tablet; Refill: 0 - Lipid panel   Patient to make mammo appointment Labs pending Health maintenance reviewed Diet and exercise encouraged Continue all meds Follow up  In 6 months   [place], FNP

## 2014-12-27 NOTE — Addendum Note (Signed)
Addended by: Thana Ates on: 12/27/2014 09:51 AM   Modules accepted: Orders

## 2014-12-27 NOTE — Patient Instructions (Signed)
Exercise to Stay Healthy Exercise helps you become and stay healthy. EXERCISE IDEAS AND TIPS Choose exercises that:  You enjoy.  Fit into your day. You do not need to exercise really hard to be healthy. You can do exercises at a slow or medium level and stay healthy. You can:  Stretch before and after working out.  Try yoga, Pilates, or tai chi.  Lift weights.  Walk fast, swim, jog, run, climb stairs, bicycle, dance, or rollerskate.  Take aerobic classes. Exercises that burn about 150 calories:  Running 1  miles in 15 minutes.  Playing volleyball for 45 to 60 minutes.  Washing and waxing a car for 45 to 60 minutes.  Playing touch football for 45 minutes.  Walking 1  miles in 35 minutes.  Pushing a stroller 1  miles in 30 minutes.  Playing basketball for 30 minutes.  Raking leaves for 30 minutes.  Bicycling 5 miles in 30 minutes.  Walking 2 miles in 30 minutes.  Dancing for 30 minutes.  Shoveling snow for 15 minutes.  Swimming laps for 20 minutes.  Walking up stairs for 15 minutes.  Bicycling 4 miles in 15 minutes.  Gardening for 30 to 45 minutes.  Jumping rope for 15 minutes.  Washing windows or floors for 45 to 60 minutes. Document Released: 07/19/2010 Document Revised: 09/08/2011 Document Reviewed: 07/19/2010 [employer] Patient Information 2015 [employer]. This information is not intended to replace advice given to you by your health care provider. Make sure you discuss any questions you have with your health care provider.  

## 2014-12-27 NOTE — Addendum Note (Signed)
Addended by: Chevis Pretty on: 12/27/2014 09:37 AM   Modules accepted: Level of Service

## 2014-12-28 LAB — CMP14+EGFR
A/G RATIO: 1.8 (ref 1.1–2.5)
ALT: 39 IU/L — AB (ref 0–32)
AST: 28 IU/L (ref 0–40)
Albumin: 4.6 g/dL (ref 3.6–4.8)
Alkaline Phosphatase: 77 IU/L (ref 39–117)
BUN / CREAT RATIO: 13 (ref 11–26)
BUN: 12 mg/dL (ref 8–27)
Bilirubin Total: 0.4 mg/dL (ref 0.0–1.2)
CALCIUM: 9.9 mg/dL (ref 8.7–10.3)
CO2: 24 mmol/L (ref 18–29)
CREATININE: 0.95 mg/dL (ref 0.57–1.00)
Chloride: 103 mmol/L (ref 97–108)
GFR calc Af Amer: 73 mL/min/{1.73_m2} (ref >59)
GFR calc non Af Amer: 63 mL/min/{1.73_m2} (ref >59)
GLUCOSE: 96 mg/dL (ref 65–99)
Globulin, Total: 2.5 g/dL (ref 1.5–4.5)
Potassium: 4.9 mmol/L (ref 3.5–5.2)
SODIUM: 141 mmol/L (ref 134–144)
Total Protein: 7.1 g/dL (ref 6.0–8.5)

## 2014-12-28 LAB — LIPID PANEL
CHOLESTEROL TOTAL: 160 mg/dL (ref 100–199)
Chol/HDL Ratio: 2.4 ratio units (ref 0.0–4.4)
HDL: 68 mg/dL (ref >39)
LDL Calculated: 62 mg/dL (ref 0–99)
Triglycerides: 148 mg/dL (ref 0–149)
VLDL CHOLESTEROL CAL: 30 mg/dL (ref 5–40)

## 2015-02-26 DIAGNOSIS — Z1231 Encounter for screening mammogram for malignant neoplasm of breast: Secondary | ICD-10-CM | POA: Diagnosis not present

## 2015-04-11 ENCOUNTER — Ambulatory Visit (INDEPENDENT_AMBULATORY_CARE_PROVIDER_SITE_OTHER): Payer: Medicare Other

## 2015-04-11 ENCOUNTER — Encounter: Payer: Self-pay | Admitting: Nurse Practitioner

## 2015-04-11 ENCOUNTER — Ambulatory Visit (INDEPENDENT_AMBULATORY_CARE_PROVIDER_SITE_OTHER): Payer: Medicare Other | Admitting: Nurse Practitioner

## 2015-04-11 VITALS — BP 152/86 | HR 67 | Temp 97.5°F | Ht 64.0 in | Wt 165.0 lb

## 2015-04-11 DIAGNOSIS — Z6828 Body mass index (BMI) 28.0-28.9, adult: Secondary | ICD-10-CM | POA: Insufficient documentation

## 2015-04-11 DIAGNOSIS — G4700 Insomnia, unspecified: Secondary | ICD-10-CM | POA: Diagnosis not present

## 2015-04-11 DIAGNOSIS — Z720 Tobacco use: Secondary | ICD-10-CM | POA: Diagnosis not present

## 2015-04-11 DIAGNOSIS — Z23 Encounter for immunization: Secondary | ICD-10-CM

## 2015-04-11 DIAGNOSIS — K219 Gastro-esophageal reflux disease without esophagitis: Secondary | ICD-10-CM | POA: Diagnosis not present

## 2015-04-11 DIAGNOSIS — I10 Essential (primary) hypertension: Secondary | ICD-10-CM | POA: Diagnosis not present

## 2015-04-11 DIAGNOSIS — E039 Hypothyroidism, unspecified: Secondary | ICD-10-CM | POA: Diagnosis not present

## 2015-04-11 DIAGNOSIS — Z87891 Personal history of nicotine dependence: Secondary | ICD-10-CM

## 2015-04-11 DIAGNOSIS — E785 Hyperlipidemia, unspecified: Secondary | ICD-10-CM | POA: Diagnosis not present

## 2015-04-11 MED ORDER — OMEPRAZOLE 40 MG PO CPDR: 40.0000 mg | DELAYED_RELEASE_CAPSULE | Freq: Every day | ORAL | Status: DC

## 2015-04-11 MED ORDER — LISINOPRIL-HYDROCHLOROTHIAZIDE 20-12.5 MG PO TABS: 1.0000 | ORAL_TABLET | Freq: Every day | ORAL | Status: DC

## 2015-04-11 MED ORDER — BENAZEPRIL HCL 40 MG PO TABS: 40.0000 mg | ORAL_TABLET | Freq: Every day | ORAL | Status: DC

## 2015-04-11 MED ORDER — AMLODIPINE BESYLATE 5 MG PO TABS: 5.0000 mg | ORAL_TABLET | Freq: Every day | ORAL | Status: DC

## 2015-04-11 MED ORDER — ATORVASTATIN CALCIUM 40 MG PO TABS: ORAL_TABLET | ORAL | Status: DC

## 2015-04-11 NOTE — Patient Instructions (Signed)
Health Maintenance, Female Adopting a healthy lifestyle and getting preventive care can go a long way to promote health and wellness. Talk with your health care provider about what schedule of regular examinations is right for you. This is a good chance for you to check in with your provider about disease prevention and staying healthy. In between checkups, there are plenty of things you can do on your own. Experts have done a lot of research about which lifestyle changes and preventive measures are most likely to keep you healthy. Ask your health care provider for more information. WEIGHT AND DIET  Eat a healthy diet  Be sure to include plenty of vegetables, fruits, low-fat dairy products, and lean protein.  Do not eat a lot of foods high in solid fats, added sugars, or salt.  Get regular exercise. This is one of the most important things you can do for your health.  Most adults should exercise for at least 150 minutes each week. The exercise should increase your heart rate and make you sweat (moderate-intensity exercise).  Most adults should also do strengthening exercises at least twice a week. This is in addition to the moderate-intensity exercise.  Maintain a healthy weight  Body mass index (BMI) is a measurement that can be used to identify possible weight problems. It estimates body fat based on height and weight. Your health care provider can help determine your BMI and help you achieve or maintain a healthy weight.  For females 20 years of age and older:   A BMI below 18.5 is considered underweight.  A BMI of 18.5 to 24.9 is normal.  A BMI of 25 to 29.9 is considered overweight.  A BMI of 30 and above is considered obese.  Watch levels of cholesterol and blood lipids  You should start having your blood tested for lipids and cholesterol at 65 years of age, then have this test every 5 years.  You may need to have your cholesterol levels checked more often if:  Your lipid  or cholesterol levels are high.  You are older than 65 years of age.  You are at high risk for heart disease.  CANCER SCREENING   Lung Cancer  Lung cancer screening is recommended for adults 55-80 years old who are at high risk for lung cancer because of a history of smoking.  A yearly low-dose CT scan of the lungs is recommended for people who:  Currently smoke.  Have quit within the past 15 years.  Have at least a 30-pack-year history of smoking. A pack year is smoking an average of one pack of cigarettes a day for 1 year.  Yearly screening should continue until it has been 15 years since you quit.  Yearly screening should stop if you develop a health problem that would prevent you from having lung cancer treatment.  Breast Cancer  Practice breast self-awareness. This means understanding how your breasts normally appear and feel.  It also means doing regular breast self-exams. Let your health care provider know about any changes, no matter how small.  If you are in your 20s or 30s, you should have a clinical breast exam (CBE) by a health care provider every 1-3 years as part of a regular health exam.  If you are 40 or older, have a CBE every year. Also consider having a breast X-ray (mammogram) every year.  If you have a family history of breast cancer, talk to your health care provider about genetic screening.  If you   are at high risk for breast cancer, talk to your health care provider about having an MRI and a mammogram every year.  Breast cancer gene (BRCA) assessment is recommended for women who have family members with BRCA-related cancers. BRCA-related cancers include:  Breast.  Ovarian.  Tubal.  Peritoneal cancers.  Results of the assessment will determine the need for genetic counseling and BRCA1 and BRCA2 testing. Cervical Cancer Your health care provider may recommend that you be screened regularly for cancer of the pelvic organs (ovaries, uterus, and  vagina). This screening involves a pelvic examination, including checking for microscopic changes to the surface of your cervix (Pap test). You may be encouraged to have this screening done every 3 years, beginning at age 21.  For women ages 30-65, health care providers may recommend pelvic exams and Pap testing every 3 years, or they may recommend the Pap and pelvic exam, combined with testing for human papilloma virus (HPV), every 5 years. Some types of HPV increase your risk of cervical cancer. Testing for HPV may also be done on women of any age with unclear Pap test results.  Other health care providers may not recommend any screening for nonpregnant women who are considered low risk for pelvic cancer and who do not have symptoms. Ask your health care provider if a screening pelvic exam is right for you.  If you have had past treatment for cervical cancer or a condition that could lead to cancer, you need Pap tests and screening for cancer for at least 20 years after your treatment. If Pap tests have been discontinued, your risk factors (such as having a new sexual partner) need to be reassessed to determine if screening should resume. Some women have medical problems that increase the chance of getting cervical cancer. In these cases, your health care provider may recommend more frequent screening and Pap tests. Colorectal Cancer  This type of cancer can be detected and often prevented.  Routine colorectal cancer screening usually begins at 65 years of age and continues through 65 years of age.  Your health care provider may recommend screening at an earlier age if you have risk factors for colon cancer.  Your health care provider may also recommend using home test kits to check for hidden blood in the stool.  A small camera at the end of a tube can be used to examine your colon directly (sigmoidoscopy or colonoscopy). This is done to check for the earliest forms of colorectal  cancer.  Routine screening usually begins at age 50.  Direct examination of the colon should be repeated every 5-10 years through 65 years of age. However, you may need to be screened more often if early forms of precancerous polyps or small growths are found. Skin Cancer  Check your skin from head to toe regularly.  Tell your health care provider about any new moles or changes in moles, especially if there is a change in a mole's shape or color.  Also tell your health care provider if you have a mole that is larger than the size of a pencil eraser.  Always use sunscreen. Apply sunscreen liberally and repeatedly throughout the day.  Protect yourself by wearing long sleeves, pants, a wide-brimmed hat, and sunglasses whenever you are outside. HEART DISEASE, DIABETES, AND HIGH BLOOD PRESSURE   High blood pressure causes heart disease and increases the risk of stroke. High blood pressure is more likely to develop in:  People who have blood pressure in the high end   of the normal range (130-139/85-89 mm Hg).  People who are overweight or obese.  People who are African American.  If you are 38-23 years of age, have your blood pressure checked every 3-5 years. If you are 61 years of age or older, have your blood pressure checked every year. You should have your blood pressure measured twice--once when you are at a hospital or clinic, and once when you are not at a hospital or clinic. Record the average of the two measurements. To check your blood pressure when you are not at a hospital or clinic, you can use:  An automated blood pressure machine at a pharmacy.  A home blood pressure monitor.  If you are between 45 years and 39 years old, ask your health care provider if you should take aspirin to prevent strokes.  Have regular diabetes screenings. This involves taking a blood sample to check your fasting blood sugar level.  If you are at a normal weight and have a low risk for diabetes,  have this test once every three years after 65 years of age.  If you are overweight and have a high risk for diabetes, consider being tested at a younger age or more often. PREVENTING INFECTION  Hepatitis B  If you have a higher risk for hepatitis B, you should be screened for this virus. You are considered at high risk for hepatitis B if:  You were born in a country where hepatitis B is common. Ask your health care provider which countries are considered high risk.  Your parents were born in a high-risk country, and you have not been immunized against hepatitis B (hepatitis B vaccine).  You have HIV or AIDS.  You use needles to inject street drugs.  You live with someone who has hepatitis B.  You have had sex with someone who has hepatitis B.  You get hemodialysis treatment.  You take certain medicines for conditions, including cancer, organ transplantation, and autoimmune conditions. Hepatitis C  Blood testing is recommended for:  Everyone born from [birth year] through 1966.  Anyone with known risk factors for hepatitis C. Sexually transmitted infections (STIs)  You should be screened for sexually transmitted infections (STIs) including gonorrhea and chlamydia if:  You are sexually active and are younger than 65 years of age.  You are older than 65 years of age and your health care provider tells you that you are at risk for this type of infection.  Your sexual activity has changed since you were last screened and you are at an increased risk for chlamydia or gonorrhea. Ask your health care provider if you are at risk.  If you do not have HIV, but are at risk, it may be recommended that you take a prescription medicine daily to prevent HIV infection. This is called pre-exposure prophylaxis (PrEP). You are considered at risk if:  You are sexually active and do not regularly use condoms or know the HIV status of your partner(s).  You take drugs by injection.  You are sexually  active with a partner who has HIV. Talk with your health care provider about whether you are at high risk of being infected with HIV. If you choose to begin PrEP, you should first be tested for HIV. You should then be tested every 3 months for as long as you are taking PrEP.  PREGNANCY   If you are premenopausal and you may become pregnant, ask your health care provider about preconception counseling.  If you may  become pregnant, take 400 to 800 micrograms (mcg) of folic acid every day.  If you want to prevent pregnancy, talk to your health care provider about birth control (contraception). OSTEOPOROSIS AND MENOPAUSE   Osteoporosis is a disease in which the bones lose minerals and strength with aging. This can result in serious bone fractures. Your risk for osteoporosis can be identified using a bone density scan.  If you are 61 years of age or older, or if you are at risk for osteoporosis and fractures, ask your health care provider if you should be screened.  Ask your health care provider whether you should take a calcium or vitamin D supplement to lower your risk for osteoporosis.  Menopause may have certain physical symptoms and risks.  Hormone replacement therapy may reduce some of these symptoms and risks. Talk to your health care provider about whether hormone replacement therapy is right for you.  HOME CARE INSTRUCTIONS   Schedule regular health, dental, and eye exams.  Stay current with your immunizations.   Do not use any tobacco products including cigarettes, chewing tobacco, or electronic cigarettes.  If you are pregnant, do not drink alcohol.  If you are breastfeeding, limit how much and how often you drink alcohol.  Limit alcohol intake to no more than 1 drink per day for nonpregnant women. One drink equals 12 ounces of beer, 5 ounces of wine, or 1 ounces of hard liquor.  Do not use street drugs.  Do not share needles.  Ask your health care provider for help if  you need support or information about quitting drugs.  Tell your health care provider if you often feel depressed.  Tell your health care provider if you have ever been abused or do not feel safe at home.   This information is not intended to replace advice given to you by your health care provider. Make sure you discuss any questions you have with your health care provider.   Document Released: 12/30/2010 Document Revised: 07/07/2014 Document Reviewed: 05/18/2013 Elsevier Interactive Patient Education [employer].

## 2015-04-11 NOTE — Progress Notes (Signed)
Subjective:    Patient ID: Gabriela Gardner, female    DOB: 1950/12/22, 64 y.o.   MRN: [id number]  Patient here today for follow up of chronic medical problems.    C/O acid reflux taht seems to be occuring 2-3 x a day- antiacids help relieve symptoms.  Hypertension This is a chronic problem. The current episode started more than 1 year ago. The problem has been waxing and waning since onset. Pertinent negatives include no chest pain, headaches or shortness of breath. Agents associated with hypertension include thyroid hormones. Risk factors for coronary artery disease include dyslipidemia, post-menopausal state and sedentary lifestyle. Past treatments include ACE inhibitors and calcium channel blockers. The current treatment provides moderate improvement. Compliance problems include exercise and diet.  Hypertensive end-organ damage includes a thyroid problem.  Gastrophageal Reflux She complains of coughing and heartburn. She reports no abdominal pain, no chest pain, no choking, no hoarse voice or no sore throat. This is a new problem. The current episode started more than 1 month ago. The problem occurs frequently. The problem has been gradually worsening (Never used to experience this, lately experiences 3-4 times per week. ). The heartburn duration is several minutes (Relieved within 5 minutes of taking OTC antacid.). Heartburn location: Mainly in throat.  The heartburn is of moderate intensity. The heartburn does not wake her from sleep. The heartburn does not limit her activity. The heartburn doesn't change with position. Nothing aggravates the symptoms. Pertinent negatives include no weight loss. Risk factors include caffeine use. She has tried an antacid for the symptoms. The treatment provided significant relief.  Hyperlipidemia This is a chronic problem. The current episode started more than 1 year ago. Exacerbating diseases include hypothyroidism. Pertinent negatives include no chest pain or  shortness of breath. Current antihyperlipidemic treatment includes statins. Compliance problems include adherence to diet and adherence to exercise.  Risk factors for coronary artery disease include dyslipidemia, hypertension, obesity, post-menopausal and a sedentary lifestyle.  Thyroid Problem Patient reports no constipation, diaphoresis, diarrhea, heat intolerance, hoarse voice, tremors or weight loss. Past treatments include levothyroxine. Her past medical history is significant for hyperlipidemia, obesity and osteopenia. Risk factors include family history of hypothyroidism.  insomnia ambien nightly- helps her rest without side effects    Review of Systems  Constitutional: Negative for weight loss and diaphoresis.  HENT: Negative for hoarse voice and sore throat.   Respiratory: Positive for cough. Negative for choking, chest tightness and shortness of breath.   Cardiovascular: Negative for chest pain and leg swelling.  Gastrointestinal: Positive for heartburn. Negative for abdominal pain, diarrhea and constipation.  Endocrine: Negative for heat intolerance.  Neurological: Negative for tremors, weakness and headaches.  All other systems reviewed and are negative.      Objective:   Physical Exam  Constitutional: She is oriented to person, place, and time. She appears well-developed and well-nourished.  HENT:  Nose: Nose normal.  Mouth/Throat: Oropharynx is clear and moist.  Eyes: EOM are normal.  Neck: Trachea normal, normal range of motion and full passive range of motion without pain. Neck supple. No JVD present. Carotid bruit is not present. No thyromegaly present.  Cardiovascular: Normal rate, regular rhythm, normal heart sounds and intact distal pulses.  Exam reveals no gallop and no friction rub.   No murmur heard. Pulmonary/Chest: Effort normal and breath sounds normal.  Abdominal: Soft. Bowel sounds are normal. She exhibits no distension and no mass. There is no tenderness.   Musculoskeletal: Normal range of motion.  Lymphadenopathy:  She has no cervical adenopathy.  Neurological: She is alert and oriented to person, place, and time. She has normal reflexes.  Skin: Skin is warm and dry.  Psychiatric: She has a normal mood and affect. Her behavior is normal. Judgment and thought content normal.   BP 152/86 mmHg  Pulse 67  Temp(Src) 97.5 F (36.4 C) (Oral)  Ht 5' 4"  (1.626 m)  Wt 165 lb (74.844 kg)  BMI 28.31 kg/m2  EKG- NSR-Gabriela Hassell Done, FNP   Chest x ray- no cardiopulmonary disease-Preliminary reading by Ronnald Collum, FNP  [facility]        Assessment & Plan:   1. Essential hypertension Do not add salt to diet - amLODipine (NORVASC) 5 MG tablet; Take 1 tablet (5 mg total) by mouth daily.  Dispense: 90 tablet; Refill: 0 - CMP14+EGFR - EKG 12-Lead - lisinopril-hydrochlorothiazide (ZESTORETIC) 20-12.5 MG tablet; Take 1 tablet by mouth daily.  Dispense: 180 tablet; Refill: 1  2. Hypothyroidism, unspecified hypothyroidism type - Thyroid Panel With TSH  3. Hyperlipidemia Low fat diet - atorvastatin (LIPITOR) 40 MG tablet; TAKE ONE TABLET BY MOUTH ONCE DAILY  Dispense: 90 tablet; Refill: 0 - Lipid panel  4. Insomnia Bedtime rtual  5. Smoking hx - DG Chest 2 View; Future  6. BMI 28.0-28.9,adult Discussed diet and exercise for person with BMI >25 Will recheck weight in 3-6 months   7. Gastroesophageal reflux disease without esophagitis Avoid spicy foods Do not eat 2 hours prior to bedtime - omeprazole (PRILOSEC) 40 MG capsule; Take 1 capsule (40 mg total) by mouth daily.  Dispense: 30 capsule; Refill: 5    Labs pending Health maintenance reviewed Diet and exercise encouraged Continue all meds Follow up  In 6 months   [place], FNP

## 2015-04-12 LAB — CMP14+EGFR
A/G RATIO: 1.7 (ref 1.1–2.5)
ALBUMIN: 4.5 g/dL (ref 3.6–4.8)
ALK PHOS: 68 IU/L (ref 39–117)
ALT: 41 IU/L — ABNORMAL HIGH (ref 0–32)
AST: 35 IU/L (ref 0–40)
BUN / CREAT RATIO: 10 — AB (ref 11–26)
BUN: 9 mg/dL (ref 8–27)
Bilirubin Total: 0.5 mg/dL (ref 0.0–1.2)
CALCIUM: 9.9 mg/dL (ref 8.7–10.3)
CO2: 24 mmol/L (ref 18–29)
CREATININE: 0.89 mg/dL (ref 0.57–1.00)
Chloride: 101 mmol/L (ref 97–108)
GFR calc Af Amer: 79 mL/min/{1.73_m2} (ref >59)
GFR, EST NON AFRICAN AMERICAN: 68 mL/min/{1.73_m2} (ref >59)
GLOBULIN, TOTAL: 2.6 g/dL (ref 1.5–4.5)
Glucose: 95 mg/dL (ref 65–99)
Potassium: 4.5 mmol/L (ref 3.5–5.2)
Sodium: 140 mmol/L (ref 134–144)
Total Protein: 7.1 g/dL (ref 6.0–8.5)

## 2015-04-12 LAB — LIPID PANEL
CHOLESTEROL TOTAL: 157 mg/dL (ref 100–199)
Chol/HDL Ratio: 2.6 ratio units (ref 0.0–4.4)
HDL: 60 mg/dL (ref >39)
LDL CALC: 55 mg/dL (ref 0–99)
TRIGLYCERIDES: 209 mg/dL — AB (ref 0–149)
VLDL CHOLESTEROL CAL: 42 mg/dL — AB (ref 5–40)

## 2015-04-12 LAB — THYROID PANEL WITH TSH
Free Thyroxine Index: 2.2 (ref 1.2–4.9)
T3 Uptake Ratio: 25 % (ref 24–39)
T4, Total: 8.9 ug/dL (ref 4.5–12.0)
TSH: 2.98 u[IU]/mL (ref 0.450–4.500)

## 2015-04-17 ENCOUNTER — Telehealth: Payer: Self-pay | Admitting: Nurse Practitioner

## 2015-04-17 DIAGNOSIS — I10 Essential (primary) hypertension: Secondary | ICD-10-CM

## 2015-04-17 MED ORDER — LISINOPRIL-HYDROCHLOROTHIAZIDE 20-12.5 MG PO TABS: 2.0000 | ORAL_TABLET | Freq: Every day | ORAL | Status: DC

## 2015-04-17 NOTE — Telephone Encounter (Signed)
Call pharmacy and tell them previous lisinopril Gabriela Gardner was in correct was suppose to be BID and i did order 180 originally- please correct amount with patient because she only got #90

## 2015-05-09 ENCOUNTER — Ambulatory Visit: Payer: Medicare Other | Admitting: *Deleted

## 2015-05-09 VITALS — BP 116/69 | HR 61

## 2015-05-10 NOTE — Progress Notes (Signed)
Blood pressure normal. Patient will follow up as planned.

## 2015-06-08 ENCOUNTER — Other Ambulatory Visit: Payer: Self-pay | Admitting: Nurse Practitioner

## 2015-08-09 ENCOUNTER — Other Ambulatory Visit: Payer: Self-pay | Admitting: Nurse Practitioner

## 2015-08-22 ENCOUNTER — Other Ambulatory Visit: Payer: Self-pay | Admitting: Nurse Practitioner

## 2015-08-28 ENCOUNTER — Other Ambulatory Visit: Payer: Self-pay | Admitting: Nurse Practitioner

## 2015-09-24 ENCOUNTER — Other Ambulatory Visit: Payer: Self-pay | Admitting: Nurse Practitioner

## 2015-10-11 ENCOUNTER — Ambulatory Visit (INDEPENDENT_AMBULATORY_CARE_PROVIDER_SITE_OTHER): Payer: Medicare Other | Admitting: Nurse Practitioner

## 2015-10-11 ENCOUNTER — Encounter: Payer: Self-pay | Admitting: Nurse Practitioner

## 2015-10-11 VITALS — BP 127/78 | HR 71 | Temp 97.3°F | Ht 64.0 in | Wt 164.6 lb

## 2015-10-11 DIAGNOSIS — M8580 Other specified disorders of bone density and structure, unspecified site: Secondary | ICD-10-CM | POA: Diagnosis not present

## 2015-10-11 DIAGNOSIS — E785 Hyperlipidemia, unspecified: Secondary | ICD-10-CM

## 2015-10-11 DIAGNOSIS — I10 Essential (primary) hypertension: Secondary | ICD-10-CM | POA: Diagnosis not present

## 2015-10-11 DIAGNOSIS — Z1159 Encounter for screening for other viral diseases: Secondary | ICD-10-CM

## 2015-10-11 DIAGNOSIS — E039 Hypothyroidism, unspecified: Secondary | ICD-10-CM

## 2015-10-11 DIAGNOSIS — G4700 Insomnia, unspecified: Secondary | ICD-10-CM | POA: Diagnosis not present

## 2015-10-11 DIAGNOSIS — K219 Gastro-esophageal reflux disease without esophagitis: Secondary | ICD-10-CM

## 2015-10-11 DIAGNOSIS — Z1212 Encounter for screening for malignant neoplasm of rectum: Secondary | ICD-10-CM | POA: Diagnosis not present

## 2015-10-11 DIAGNOSIS — Z6828 Body mass index (BMI) 28.0-28.9, adult: Secondary | ICD-10-CM | POA: Diagnosis not present

## 2015-10-11 MED ORDER — LISINOPRIL-HYDROCHLOROTHIAZIDE 20-12.5 MG PO TABS
2.0000 | ORAL_TABLET | Freq: Every day | ORAL | Status: DC
Start: 2015-10-11 — End: 2016-04-12

## 2015-10-11 MED ORDER — LEVOTHYROXINE SODIUM 100 MCG PO TABS: 100.0000 ug | ORAL_TABLET | Freq: Every day | ORAL | Status: DC

## 2015-10-11 MED ORDER — ATORVASTATIN CALCIUM 40 MG PO TABS: 40.0000 mg | ORAL_TABLET | Freq: Every day | ORAL | Status: DC

## 2015-10-11 MED ORDER — OMEPRAZOLE 40 MG PO CPDR: 40.0000 mg | DELAYED_RELEASE_CAPSULE | Freq: Every day | ORAL | Status: DC

## 2015-10-11 MED ORDER — AMLODIPINE BESYLATE 5 MG PO TABS: 5.0000 mg | ORAL_TABLET | Freq: Every day | ORAL | Status: DC

## 2015-10-11 NOTE — Progress Notes (Signed)
Subjective:    Patient ID: Gabriela Gardner, female    DOB: January 23, 1951, 65 y.o.   MRN: [id number] Current Outpatient Prescriptions on File Prior to Visit  Medication Sig Dispense Refill  . acetaminophen (TYLENOL) 500 MG tablet Take 500 mg by mouth every 6 (six) hours as needed for pain.    Marland Kitchen amLODipine (NORVASC) 5 MG tablet TAKE 1 TABLET EVERY DAY 90 tablet 0  . atorvastatin (LIPITOR) 40 MG tablet TAKE 1 TABLET EVERY DAY 90 tablet 0  . calcium carbonate (OS-CAL) 600 MG TABS Take 600 mg by mouth daily.    . fish oil-omega-3 fatty acids 1000 MG capsule Take 1 g by mouth daily.    Marland Kitchen levothyroxine (SYNTHROID, LEVOTHROID) 100 MCG tablet TAKE 1 TABLET EVERY DAY 90 tablet 2  . lisinopril-hydrochlorothiazide (PRINZIDE,ZESTORETIC) 20-12.5 MG tablet TAKE 2 TABLETS EVERY DAY 180 tablet 0  . Multiple Vitamin (MULTIVITAMIN) tablet Take 1 tablet by mouth daily.    Marland Kitchen omeprazole (PRILOSEC) 40 MG capsule TAKE 1 CAPSULE EVERY DAY 90 capsule 0   No current facility-administered medications on file prior to visit.   Patient here today for follow up of chronic medical problems.  Omeprazole 69m is working great taking once a day in the morning.  Hypertension This is a chronic problem. The current episode started more than 1 year ago. The problem has been waxing and waning since onset. Pertinent negatives include no chest pain, headaches or shortness of breath. Agents associated with hypertension include thyroid hormones. Risk factors for coronary artery disease include dyslipidemia, post-menopausal state and sedentary lifestyle. Past treatments include ACE inhibitors and calcium channel blockers. The current treatment provides moderate improvement. Compliance problems include exercise and diet.  Hypertensive end-organ damage includes a thyroid problem.  Gastroesophageal Reflux She reports no abdominal pain, no chest pain, no choking, no coughing, no heartburn, no hoarse voice or no sore throat. This is a new problem.  The current episode started more than 1 month ago. The problem occurs frequently. The problem has been resolved (Never used to experience this, lately experiences 3-4 times per week. ). The heartburn duration is several minutes (Relieved within 5 minutes of taking OTC antacid.). Heartburn location: Mainly in throat.  The heartburn is of moderate intensity. The heartburn does not wake her from sleep. The heartburn does not limit her activity. The heartburn doesn't change with position. Nothing aggravates the symptoms. Pertinent negatives include no weight loss. Risk factors include caffeine use. She has tried an antacid for the symptoms. The treatment provided significant relief.  Hyperlipidemia This is a chronic problem. The current episode started more than 1 year ago. Exacerbating diseases include hypothyroidism. Pertinent negatives include no chest pain or shortness of breath. Current antihyperlipidemic treatment includes statins. Compliance problems include adherence to diet and adherence to exercise.  Risk factors for coronary artery disease include dyslipidemia, hypertension, obesity, post-menopausal and a sedentary lifestyle.  Thyroid Problem Presents for follow-up visit. Patient reports no constipation, diaphoresis, diarrhea, heat intolerance, hoarse voice, tremors or weight loss. The symptoms have been stable. Past treatments include levothyroxine. Her past medical history is significant for hyperlipidemia, obesity and osteopenia. Risk factors include family history of hypothyroidism.  insomnia Has not been taking the Ambien, never filled it, has reservations about taking something for sleep.     Review of Systems  Constitutional: Negative.  Negative for weight loss and diaphoresis.  HENT: Negative.  Negative for hoarse voice and sore throat.   Eyes: Negative.   Respiratory: Negative.  Negative for  cough, choking, chest tightness and shortness of breath.   Cardiovascular: Negative.  Negative  for chest pain and leg swelling.  Gastrointestinal: Negative.  Negative for heartburn, abdominal pain, diarrhea and constipation.  Endocrine: Negative.  Negative for heat intolerance.  Musculoskeletal: Negative.   Skin:       Minor psorisis bilaterally on elbows. Uses Eucerine cream.  Allergic/Immunologic: Negative.   Neurological: Negative.  Negative for tremors, weakness and headaches.  Hematological: Negative.   Psychiatric/Behavioral: Negative.   All other systems reviewed and are negative.      Objective:   Physical Exam  Constitutional: She is oriented to person, place, and time. Vital signs are normal. She appears well-developed and well-nourished.  HENT:  Right Ear: Hearing, tympanic membrane, external ear and ear canal normal.  Left Ear: Hearing, tympanic membrane, external ear and ear canal normal.  Nose: Nose normal.  Mouth/Throat: Uvula is midline, oropharynx is clear and moist and mucous membranes are normal.  Eyes: Conjunctivae and EOM are normal. Pupils are equal, round, and reactive to light.  Neck: Trachea normal, normal range of motion and full passive range of motion without pain. Neck supple. No JVD present. Carotid bruit is not present. No thyromegaly present.  Cardiovascular: Normal rate, regular rhythm, normal heart sounds and intact distal pulses.  Exam reveals no gallop and no friction rub.   No murmur heard. Pulmonary/Chest: Effort normal and breath sounds normal.  Abdominal: Soft. Normal appearance and bowel sounds are normal. She exhibits no distension and no mass. There is no tenderness.  Musculoskeletal: Normal range of motion.  Lymphadenopathy:    She has no cervical adenopathy.  Neurological: She is alert and oriented to person, place, and time. She has normal reflexes.  Skin: Skin is warm, dry and intact.  Psychiatric: She has a normal mood and affect. Her speech is normal and behavior is normal. Judgment and thought content normal. Cognition and  memory are normal.   BP 127/78 mmHg  Pulse 71  Temp(Src) 97.3 F (36.3 C) (Oral)  Ht _0  (1.626 m)  Wt 164 lb 9.6 oz (74.662 kg)  BMI 28.24 kg/m2        Assessment & Plan:   1. Essential hypertension Do not add salt to diet - CMP14+EGFR - lisinopril-hydrochlorothiazide (PRINZIDE,ZESTORETIC) 20-12.5 MG tablet; Take 2 tablets by mouth daily.  Dispense: 180 tablet; Refill: 1 - amLODipine (NORVASC) 5 MG tablet; Take 1 tablet (5 mg total) by mouth daily.  Dispense: 90 tablet; Refill: 1  2. Hyperlipidemia Low fat diet and exercise - CMP14+EGFR - Lipid panel - atorvastatin (LIPITOR) 40 MG tablet; Take 1 tablet (40 mg total) by mouth daily.  Dispense: 90 tablet; Refill: 1  3. Hypothyroidism, unspecified hypothyroidism type - CMP14+EGFR - Thyroid Panel With TSH - levothyroxine (SYNTHROID, LEVOTHROID) 100 MCG tablet; Take 1 tablet (100 mcg total) by mouth daily.  Dispense: 90 tablet; Refill: 2  4. Gastroesophageal reflux disease without esophagitis Avoid spicy foods Do not eat 2 hours prior to bedtime - omeprazole (PRILOSEC) 40 MG capsule; Take 1 capsule (40 mg total) by mouth daily.  Dispense: 90 capsule; Refill: 1  5. Osteopenia Weight bearing exercises  6. Insomnia Bedtime ritual  7. BMI 28.0-28.9,adult Discussed diet and exercise for person with BMI >25 Will recheck weight in 3-6 months  8. Screening for malignant neoplasm of the rectum - Fecal occult blood, imunochemical; Future  9. Need for hepatitis C screening test - Hepatitis C antibody    Labs pending Health maintenance reviewed  Diet and exercise encouraged Continue all meds Follow up  In 6 month   [place], [place] FNP Student

## 2015-10-11 NOTE — Patient Instructions (Signed)
Hypertension Hypertension, commonly called high blood pressure, is when the force of blood pumping through your arteries is too strong. Your arteries are the blood vessels that carry blood from your heart throughout your body. A blood pressure reading consists of a higher number over a lower number, such as 110/72. The higher number (systolic) is the pressure inside your arteries when your heart pumps. The lower number (diastolic) is the pressure inside your arteries when your heart relaxes. Ideally you want your blood pressure below 120/80. Hypertension forces your heart to work harder to pump blood. Your arteries may become narrow or stiff. Having untreated or uncontrolled hypertension can cause heart attack, stroke, kidney disease, and other problems. RISK FACTORS Some risk factors for high blood pressure are controllable. Others are not.  Risk factors you cannot control include:   Race. You may be at higher risk if you are African American.  Age. Risk increases with age.  Gender. Men are at higher risk than women before age 45 years. After age 65, women are at higher risk than men. Risk factors you can control include:  Not getting enough exercise or physical activity.  Being overweight.  Getting too much fat, sugar, calories, or salt in your diet.  Drinking too much alcohol. SIGNS AND SYMPTOMS Hypertension does not usually cause signs or symptoms. Extremely high blood pressure (hypertensive crisis) may cause headache, anxiety, shortness of breath, and nosebleed. DIAGNOSIS To check if you have hypertension, your health care provider will measure your blood pressure while you are seated, with your arm held at the level of your heart. It should be measured at least twice using the same arm. Certain conditions can cause a difference in blood pressure between your right and left arms. A blood pressure reading that is higher than normal on one occasion does not mean that you need treatment. If  it is not clear whether you have high blood pressure, you may be asked to return on a different day to have your blood pressure checked again. Or, you may be asked to monitor your blood pressure at home for 1 or more weeks. TREATMENT Treating high blood pressure includes making lifestyle changes and possibly taking medicine. Living a healthy lifestyle can help lower high blood pressure. You may need to change some of your habits. Lifestyle changes may include:  Following the DASH diet. This diet is high in fruits, vegetables, and whole grains. It is low in salt, red meat, and added sugars.  Keep your sodium intake below 2,300 mg per day.  Getting at least 30-45 minutes of aerobic exercise at least 4 times per week.  Losing weight if necessary.  Not smoking.  Limiting alcoholic beverages.  Learning ways to reduce stress. Your health care provider may prescribe medicine if lifestyle changes are not enough to get your blood pressure under control, and if one of the following is true:  You are 18-59 years of age and your systolic blood pressure is above 140.  You are 60 years of age or older, and your systolic blood pressure is above 150.  Your diastolic blood pressure is above 90.  You have diabetes, and your systolic blood pressure is over 140 or your diastolic blood pressure is over 90.  You have kidney disease and your blood pressure is above 140/90.  You have heart disease and your blood pressure is above 140/90. Your personal target blood pressure may vary depending on your medical conditions, your age, and other factors. HOME CARE INSTRUCTIONS    Have your blood pressure rechecked as directed by your health care provider.   Take medicines only as directed by your health care provider. Follow the directions carefully. Blood pressure medicines must be taken as prescribed. The medicine does not work as well when you skip doses. Skipping doses also puts you at risk for  problems.  Do not smoke.   Monitor your blood pressure at home as directed by your health care provider. SEEK MEDICAL CARE IF:   You think you are having a reaction to medicines taken.  You have recurrent headaches or feel dizzy.  You have swelling in your ankles.  You have trouble with your vision. SEEK IMMEDIATE MEDICAL CARE IF:  You develop a severe headache or confusion.  You have unusual weakness, numbness, or feel faint.  You have severe chest or abdominal pain.  You vomit repeatedly.  You have trouble breathing. MAKE SURE YOU:   Understand these instructions.  Will watch your condition.  Will get help right away if you are not doing well or get worse.   This information is not intended to replace advice given to you by your health care provider. Make sure you discuss any questions you have with your health care provider.   Document Released: 06/16/2005 Document Revised: 10/31/2014 Document Reviewed: 04/08/2013 Elsevier Interactive Patient Education 2016 Else[employer] Inc[employer] Insomnia Insomnia is a sleep disorder that makes it difficult to fall asleep or to stay asleep. Insomnia can cause tiredness (fatigue), low energy, difficulty concentrating, mood swings, and poor performance at work or school.  There are three different ways to classify insomnia:  Difficulty falling asleep.  Difficulty staying asleep.  Waking up too early in the morning. Any type of insomnia can be long-term (chronic) or short-term (acute). Both are common. Short-term insomnia usually lasts for three months or less. Chronic insomnia occurs at least three times a week for longer than three months. CAUSES  Insomnia may be caused by another condition, situation, or substance, such as:  Anxiety.  Certain medicines.  Gastroesophageal reflux disease (GERD) or other gastrointestinal conditions.  Asthma or other breathing conditions.  Restless legs syndrome, sleep apnea, or other sleep  disorders.  Chronic pain.  Menopause. This may include hot flashes.  Stroke.  Abuse of alcohol, tobacco, or illegal drugs.  Depression.  Caffeine.   Neurological disorders, such as Alzheimer disease.  An overactive thyroid (hyperthyroidism). The cause of insomnia may not be known. RISK FACTORS Risk factors for insomnia include:  Gender. Women are more commonly affected than men.  Age. Insomnia is more common as you get older.  Stress. This may involve your professional or personal life.  Income. Insomnia is more common in people with lower income.  Lack of exercise.   Irregular work schedule or night shifts.  Traveling between different time zones. SIGNS AND SYMPTOMS If you have insomnia, trouble falling asleep or trouble staying asleep is the main symptom. This may lead to other symptoms, such as:  Feeling fatigued.  Feeling nervous about going to sleep.  Not feeling rested in the morning.  Having trouble concentrating.  Feeling irritable, anxious, or depressed. TREATMENT  Treatment for insomnia depends on the cause. If your insomnia is caused by an underlying condition, treatment will focus on addressing the condition. Treatment may also include:   Medicines to help you sleep.  Counseling or therapy.  Lifestyle adjustments. HOME CARE INSTRUCTIONS   Take medicines only as directed by your health care provider.  Keep regular sleeping and waking hours.  Avoid naps.  Keep a sleep diary to help you and your health care provider figure out what could be causing your insomnia. Include:   When you sleep.  When you wake up during the night.  How well you sleep.   How rested you feel the next day.  Any side effects of medicines you are taking.  What you eat and drink.   Make your bedroom a comfortable place where it is easy to fall asleep:  Put up shades or special blackout curtains to block light from outside.  Use a white noise machine to  block noise.  Keep the temperature cool.   Exercise regularly as directed by your health care provider. Avoid exercising right before bedtime.  Use relaxation techniques to manage stress. Ask your health care provider to suggest some techniques that may work well for you. These may include:  Breathing exercises.  Routines to release muscle tension.  Visualizing peaceful scenes.  Cut back on alcohol, caffeinated beverages, and cigarettes, especially close to bedtime. These can disrupt your sleep.  Do not overeat or eat spicy foods right before bedtime. This can lead to digestive discomfort that can make it hard for you to sleep.  Limit screen use before bedtime. This includes:  Watching TV.  Using your smartphone, tablet, and computer.  Stick to a routine. This can help you fall asleep faster. Try to do a quiet activity, brush your teeth, and go to bed at the same time each night.  Get out of bed if you are still awake after 15 minutes of trying to sleep. Keep the lights down, but try reading or doing a quiet activity. When you feel sleepy, go back to bed.  Make sure that you drive carefully. Avoid driving if you feel very sleepy.  Keep all follow-up appointments as directed by your health care provider. This is important. SEEK MEDICAL CARE IF:   You are tired throughout the day or have trouble in your daily routine due to sleepiness.  You continue to have sleep problems or your sleep problems get worse. SEEK IMMEDIATE MEDICAL CARE IF:   You have serious thoughts about hurting yourself or someone else.   This information is not intended to replace advice given to you by your health care provider. Make sure you discuss any questions you have with your health care provider.   Document Released: 06/13/2000 Document Revised: 03/07/2015 Document Reviewed: 03/17/2014 Elsevier Interactive Patient Education [employer].

## 2015-10-12 LAB — CMP14+EGFR
A/G RATIO: 1.4 (ref 1.2–2.2)
ALBUMIN: 4.7 g/dL (ref 3.6–4.8)
ALT: 52 IU/L — ABNORMAL HIGH (ref 0–32)
AST: 48 IU/L — ABNORMAL HIGH (ref 0–40)
Alkaline Phosphatase: 84 IU/L (ref 39–117)
BILIRUBIN TOTAL: 0.6 mg/dL (ref 0.0–1.2)
BUN/Creatinine Ratio: 15 (ref 12–28)
BUN: 16 mg/dL (ref 8–27)
CALCIUM: 9.7 mg/dL (ref 8.7–10.3)
CHLORIDE: 99 mmol/L (ref 96–106)
CO2: 23 mmol/L (ref 18–29)
Creatinine, Ser: 1.1 mg/dL — ABNORMAL HIGH (ref 0.57–1.00)
GFR, EST AFRICAN AMERICAN: 61 mL/min/{1.73_m2} (ref >59)
GFR, EST NON AFRICAN AMERICAN: 53 mL/min/{1.73_m2} — AB (ref >59)
Globulin, Total: 3.3 g/dL (ref 1.5–4.5)
Glucose: 104 mg/dL — ABNORMAL HIGH (ref 65–99)
POTASSIUM: 4.4 mmol/L (ref 3.5–5.2)
Sodium: 140 mmol/L (ref 134–144)
TOTAL PROTEIN: 8 g/dL (ref 6.0–8.5)

## 2015-10-12 LAB — HEPATITIS C ANTIBODY: Hep C Virus Ab: <0.1 s/co ratio (ref 0.0–0.9)

## 2015-10-12 LAB — THYROID PANEL WITH TSH
FREE THYROXINE INDEX: 2 (ref 1.2–4.9)
T3 Uptake Ratio: 22 % — ABNORMAL LOW (ref 24–39)
T4 TOTAL: 9.2 ug/dL (ref 4.5–12.0)
TSH: 3.72 u[IU]/mL (ref 0.450–4.500)

## 2015-10-12 LAB — LIPID PANEL
CHOL/HDL RATIO: 2.8 ratio (ref 0.0–4.4)
Cholesterol, Total: 166 mg/dL (ref 100–199)
HDL: 59 mg/dL (ref >39)
LDL Calculated: 76 mg/dL (ref 0–99)
Triglycerides: 156 mg/dL — ABNORMAL HIGH (ref 0–149)
VLDL CHOLESTEROL CAL: 31 mg/dL (ref 5–40)

## 2015-10-26 ENCOUNTER — Other Ambulatory Visit: Payer: Medicare Other

## 2015-10-26 DIAGNOSIS — Z1212 Encounter for screening for malignant neoplasm of rectum: Secondary | ICD-10-CM

## 2015-11-01 LAB — FECAL OCCULT BLOOD, IMMUNOCHEMICAL: FECAL OCCULT BLD: NEGATIVE

## 2016-02-25 ENCOUNTER — Other Ambulatory Visit: Payer: Self-pay | Admitting: Nurse Practitioner

## 2016-02-25 DIAGNOSIS — E785 Hyperlipidemia, unspecified: Secondary | ICD-10-CM

## 2016-02-25 DIAGNOSIS — I10 Essential (primary) hypertension: Secondary | ICD-10-CM

## 2016-03-10 ENCOUNTER — Other Ambulatory Visit: Payer: Self-pay | Admitting: Nurse Practitioner

## 2016-03-10 DIAGNOSIS — K219 Gastro-esophageal reflux disease without esophagitis: Secondary | ICD-10-CM

## 2016-04-01 ENCOUNTER — Ambulatory Visit (INDEPENDENT_AMBULATORY_CARE_PROVIDER_SITE_OTHER): Payer: Medicare Other

## 2016-04-01 DIAGNOSIS — Z23 Encounter for immunization: Secondary | ICD-10-CM | POA: Diagnosis not present

## 2016-04-12 ENCOUNTER — Other Ambulatory Visit: Payer: Self-pay | Admitting: Nurse Practitioner

## 2016-04-12 DIAGNOSIS — I10 Essential (primary) hypertension: Secondary | ICD-10-CM

## 2016-04-30 ENCOUNTER — Other Ambulatory Visit: Payer: Self-pay | Admitting: Nurse Practitioner

## 2016-04-30 DIAGNOSIS — I10 Essential (primary) hypertension: Secondary | ICD-10-CM

## 2016-04-30 DIAGNOSIS — E785 Hyperlipidemia, unspecified: Secondary | ICD-10-CM

## 2016-05-15 ENCOUNTER — Other Ambulatory Visit: Payer: Self-pay | Admitting: Nurse Practitioner

## 2016-05-15 DIAGNOSIS — K219 Gastro-esophageal reflux disease without esophagitis: Secondary | ICD-10-CM

## 2016-06-10 DIAGNOSIS — H2513 Age-related nuclear cataract, bilateral: Secondary | ICD-10-CM | POA: Diagnosis not present

## 2016-06-10 DIAGNOSIS — H11823 Conjunctivochalasis, bilateral: Secondary | ICD-10-CM | POA: Diagnosis not present

## 2016-06-17 ENCOUNTER — Other Ambulatory Visit: Payer: Self-pay | Admitting: Nurse Practitioner

## 2016-06-17 DIAGNOSIS — I10 Essential (primary) hypertension: Secondary | ICD-10-CM

## 2016-06-24 NOTE — Telephone Encounter (Signed)
Last refill without being seen 

## 2016-06-25 ENCOUNTER — Encounter: Payer: Medicare Other | Admitting: *Deleted

## 2016-07-07 ENCOUNTER — Other Ambulatory Visit: Payer: Self-pay | Admitting: Nurse Practitioner

## 2016-07-07 DIAGNOSIS — E785 Hyperlipidemia, unspecified: Secondary | ICD-10-CM

## 2016-07-07 DIAGNOSIS — I10 Essential (primary) hypertension: Secondary | ICD-10-CM

## 2016-07-15 ENCOUNTER — Other Ambulatory Visit: Payer: Self-pay | Admitting: Nurse Practitioner

## 2016-07-15 DIAGNOSIS — E039 Hypothyroidism, unspecified: Secondary | ICD-10-CM

## 2016-07-22 ENCOUNTER — Other Ambulatory Visit: Payer: Self-pay | Admitting: Nurse Practitioner

## 2016-07-22 DIAGNOSIS — K219 Gastro-esophageal reflux disease without esophagitis: Secondary | ICD-10-CM

## 2016-08-26 ENCOUNTER — Other Ambulatory Visit: Payer: Self-pay | Admitting: Nurse Practitioner

## 2016-08-26 DIAGNOSIS — I10 Essential (primary) hypertension: Secondary | ICD-10-CM

## 2016-09-08 ENCOUNTER — Other Ambulatory Visit: Payer: Self-pay | Admitting: Nurse Practitioner

## 2016-09-08 DIAGNOSIS — E785 Hyperlipidemia, unspecified: Secondary | ICD-10-CM

## 2016-09-08 DIAGNOSIS — I10 Essential (primary) hypertension: Secondary | ICD-10-CM

## 2016-09-08 NOTE — Telephone Encounter (Signed)
Last office visit and labwork April 2017, please advise

## 2016-09-09 NOTE — Telephone Encounter (Signed)
Will refill amlodipine x1 but refused atorvastatin until patient has follow up appointmnet

## 2016-09-09 NOTE — Telephone Encounter (Signed)
Pt aware. Appt made for T 3/20

## 2016-09-16 ENCOUNTER — Ambulatory Visit: Payer: Medicare Other | Admitting: Nurse Practitioner

## 2016-09-19 ENCOUNTER — Ambulatory Visit (INDEPENDENT_AMBULATORY_CARE_PROVIDER_SITE_OTHER): Payer: Medicare Other | Admitting: Nurse Practitioner

## 2016-09-19 ENCOUNTER — Encounter: Payer: Self-pay | Admitting: Nurse Practitioner

## 2016-09-19 VITALS — BP 139/82 | HR 64 | Temp 98.0°F | Ht 64.0 in | Wt 164.0 lb

## 2016-09-19 DIAGNOSIS — Z6828 Body mass index (BMI) 28.0-28.9, adult: Secondary | ICD-10-CM

## 2016-09-19 DIAGNOSIS — F5101 Primary insomnia: Secondary | ICD-10-CM

## 2016-09-19 DIAGNOSIS — I10 Essential (primary) hypertension: Secondary | ICD-10-CM | POA: Diagnosis not present

## 2016-09-19 DIAGNOSIS — E039 Hypothyroidism, unspecified: Secondary | ICD-10-CM

## 2016-09-19 DIAGNOSIS — K219 Gastro-esophageal reflux disease without esophagitis: Secondary | ICD-10-CM

## 2016-09-19 DIAGNOSIS — E7800 Pure hypercholesterolemia, unspecified: Secondary | ICD-10-CM | POA: Diagnosis not present

## 2016-09-19 DIAGNOSIS — Z23 Encounter for immunization: Secondary | ICD-10-CM

## 2016-09-19 DIAGNOSIS — M1990 Unspecified osteoarthritis, unspecified site: Secondary | ICD-10-CM | POA: Diagnosis not present

## 2016-09-19 MED ORDER — AMLODIPINE BESYLATE 5 MG PO TABS: 5.0000 mg | ORAL_TABLET | Freq: Every day | ORAL | 1 refills | Status: DC

## 2016-09-19 MED ORDER — MELOXICAM 15 MG PO TABS: 15.0000 mg | ORAL_TABLET | Freq: Every day | ORAL | 0 refills | Status: DC

## 2016-09-19 MED ORDER — ATORVASTATIN CALCIUM 40 MG PO TABS: ORAL_TABLET | ORAL | 1 refills | Status: DC

## 2016-09-19 MED ORDER — LISINOPRIL-HYDROCHLOROTHIAZIDE 20-12.5 MG PO TABS: ORAL_TABLET | ORAL | 1 refills | Status: DC

## 2016-09-19 MED ORDER — LEVOTHYROXINE SODIUM 100 MCG PO TABS: 100.0000 ug | ORAL_TABLET | Freq: Every day | ORAL | 1 refills | Status: DC

## 2016-09-19 MED ORDER — OMEPRAZOLE 40 MG PO CPDR: DELAYED_RELEASE_CAPSULE | ORAL | 1 refills | Status: DC

## 2016-09-19 NOTE — Patient Instructions (Signed)
Arthritis Arthritis means joint pain. It can also mean joint disease. A joint is a place where bones come together. People who have arthritis may have:  Red joints.  Swollen joints.  Stiff joints.  Warm joints.  A fever.  A feeling of being sick. Follow these instructions at home: Pay attention to any changes in your symptoms. Take these actions to help with your pain and swelling. Medicines   Take over-the-counter and prescription medicines only as told by your doctor.  Do not take aspirin for pain if your doctor says that you may have gout. Activity   Rest your joint if your doctor tells you to.  Avoid activities that make the pain worse.  Exercise your joint regularly as told by your doctor. Try doing exercises like:  Swimming.  Water aerobics.  Biking.  Walking. Joint Care    If your joint is swollen, keep it raised (elevated) if told by your doctor.  If your joint feels stiff in the morning, try taking a warm shower.  If you have diabetes, do not apply heat without asking your doctor.  If told, apply heat to the joint:  Put a towel between the joint and the hot pack or heating pad.  Leave the heat on the area for 20-30 minutes.  If told, apply ice to the joint:  Put ice in a plastic bag.  Place a towel between your skin and the bag.  Leave the ice on for 20 minutes, 2-3 times per day.  Keep all follow-up visits as told by your doctor. Contact a doctor if:  The pain gets worse.  You have a fever. Get help right away if:  You have very bad pain in your joint.  You have swelling in your joint.  Your joint is red.  Many joints become painful and swollen.  You have very bad back pain.  Your leg is very weak.  You cannot control your pee (urine) or poop (stool). This information is not intended to replace advice given to you by your health care provider. Make sure you discuss any questions you have with your health care  provider. Document Released: 09/10/2009 Document Revised: 11/22/2015 Document Reviewed: 09/11/2014 Elsevier Interactive Patient Education  2017 Elsevier Inc[employer]  

## 2016-09-19 NOTE — Progress Notes (Signed)
Subjective:    Patient ID: [name], female    DOB: 03/05/1951, 66 y.o.   MRN: [id number]  Patient here today for follow up of chronic medical problems. No changes since last visit. No complaints today.  Outpatient Encounter Prescriptions as of 09/19/2016  Medication Sig  . acetaminophen (TYLENOL) 500 MG tablet Take 500 mg by mouth every 6 (six) hours as needed for pain.  Marland Kitchen amLODipine (NORVASC) 5 MG tablet TAKE 1 TABLET EVERY DAY  . atorvastatin (LIPITOR) 40 MG tablet TAKE 1 TABLET EVERY DAY (NEEDS TO BE SEEN)   . calcium carbonate (OS-CAL) 600 MG TABS Take 600 mg by mouth daily.  Marland Kitchen levothyroxine (SYNTHROID, LEVOTHROID) 100 MCG tablet TAKE 1 TABLET EVERY DAY  . lisinopril-hydrochlorothiazide (PRINZIDE,ZESTORETIC) 20-12.5 MG tablet TAKE 2 TABLETS EVERY DAY (NEED MD APPOINTMENT)  . Multiple Vitamin (MULTIVITAMIN) tablet Take 1 tablet by mouth daily.  Marland Kitchen omeprazole (PRILOSEC) 40 MG capsule TAKE 1 CAPSULE EVERY DAY  . NEED MD APPOINTMENT FOR REFILLS   Hypertension  This is a chronic problem. The current episode started more than 1 year ago. The problem has been waxing and waning since onset. Pertinent negatives include no chest pain, headaches or shortness of breath. Agents associated with hypertension include thyroid hormones. Risk factors for coronary artery disease include dyslipidemia, post-menopausal state and sedentary lifestyle. Past treatments include ACE inhibitors and calcium channel blockers. The current treatment provides moderate improvement. Compliance problems include exercise and diet.  Identifiable causes of hypertension include a thyroid problem.  Gastroesophageal Reflux  She reports no abdominal pain, no chest pain, no choking, no coughing, no heartburn, no hoarse voice or no sore throat. This is a new problem. The current episode started more than 1 month ago. The problem occurs frequently. The problem has been resolved (Never used to experience this, lately experiences 3-4 times  per week. ). The heartburn duration is several minutes (Relieved within 5 minutes of taking OTC antacid.). Heartburn location: Mainly in throat.  The heartburn is of moderate intensity. The heartburn does not wake her from sleep. The heartburn does not limit her activity. The heartburn doesn't change with position. Nothing aggravates the symptoms. Pertinent negatives include no weight loss. Risk factors include caffeine use. She has tried an antacid for the symptoms. The treatment provided significant relief.  Hyperlipidemia  This is a chronic problem. The current episode started more than 1 year ago. Exacerbating diseases include hypothyroidism. Pertinent negatives include no chest pain or shortness of breath. Current antihyperlipidemic treatment includes statins. Compliance problems include adherence to diet and adherence to exercise.  Risk factors for coronary artery disease include dyslipidemia, hypertension, obesity, post-menopausal and a sedentary lifestyle.  Thyroid Problem  Presents for follow-up visit. Patient reports no constipation, diaphoresis, diarrhea, heat intolerance, hoarse voice, tremors or weight loss. The symptoms have been stable. Past treatments include levothyroxine. Her past medical history is significant for hyperlipidemia, obesity and osteopenia. Risk factors include family history of hypothyroidism.  insomnia Has not been taking the Ambien, never filled it, has reservations about taking something for sleep.     Review of Systems  Constitutional: Negative.  Negative for diaphoresis and weight loss.  HENT: Negative.  Negative for hoarse voice and sore throat.   Eyes: Negative.   Respiratory: Negative.  Negative for cough, choking, chest tightness and shortness of breath.   Cardiovascular: Negative.  Negative for chest pain and leg swelling.  Gastrointestinal: Negative.  Negative for abdominal pain, constipation, diarrhea and heartburn.  Endocrine: Negative.  Negative  for  heat intolerance.  Musculoskeletal: Negative.   Skin:       Minor psorisis bilaterally on elbows. Uses Eucerine cream.  Allergic/Immunologic: Negative.   Neurological: Negative.  Negative for tremors, weakness and headaches.  Hematological: Negative.   Psychiatric/Behavioral: Negative.   All other systems reviewed and are negative.      Objective:   Physical Exam  Constitutional: She is oriented to person, place, and time. Vital signs are normal. She appears well-developed and well-nourished.  HENT:  Right Ear: Hearing, tympanic membrane, external ear and ear canal normal.  Left Ear: Hearing, tympanic membrane, external ear and ear canal normal.  Nose: Nose normal.  Mouth/Throat: Uvula is midline, oropharynx is clear and moist and mucous membranes are normal.  Eyes: Conjunctivae and EOM are normal. Pupils are equal, round, and reactive to light.  Neck: Trachea normal, normal range of motion and full passive range of motion without pain. Neck supple. No JVD present. Carotid bruit is not present. No thyromegaly present.  Cardiovascular: Normal rate, regular rhythm, normal heart sounds and intact distal pulses.  Exam reveals no gallop and no friction rub.   No murmur heard. Pulmonary/Chest: Effort normal and breath sounds normal.  Abdominal: Soft. Normal appearance and bowel sounds are normal. She exhibits no distension and no mass. There is no tenderness.  Musculoskeletal:  Nodules noted to PIP joints of bilateral fingers, limited movement of right index and ring finger noted.  Pain with FROM of bilateral thumbs.   Lymphadenopathy:    She has no cervical adenopathy.  Neurological: She is alert and oriented to person, place, and time. She has normal reflexes.  Skin: Skin is warm, dry and intact.  Psychiatric: She has a normal mood and affect. Her speech is normal and behavior is normal. Judgment and thought content normal. Cognition and memory are normal.   BP 139/82   Pulse 64    Temp 98 F (36.7 C) (Oral)   Ht _0  (1.626 m)   Wt 164 lb (74.4 kg)   BMI 28.15 kg/m         Assessment & Plan:  1. Essential hypertension Do not add salt to diet - lisinopril-hydrochlorothiazide (PRINZIDE,ZESTORETIC) 20-12.5 MG tablet; TAKE 2 TABLETS EVERY DAY (NEED MD APPOINTMENT)  Dispense: 180 tablet; Refill: 1 - amLODipine (NORVASC) 5 MG tablet; Take 1 tablet (5 mg total) by mouth daily.  Dispense: 90 tablet; Refill: 1 - CMP14+EGFR  2. Gastroesophageal reflux disease without esophagitis Avoid spicy foods Do not eat 2 hours prior to bedtime - omeprazole (PRILOSEC) 40 MG capsule; TAKE 1 CAPSULE EVERY DAY  . NEED MD APPOINTMENT FOR REFILLS  Dispense: 90 capsule; Refill: 1  3. Osteopenia, unspecified location Weight bearing exercises  4. BMI 28.0-28.9,adult Healthy diet daily exercise  5. Pure hypercholesterolemia Low fat diet - atorvastatin (LIPITOR) 40 MG tablet; TAKE 1 TABLET EVERY DAY (NEEDS TO BE SEEN)  Dispense: 90 tablet; Refill: 1 - Lipid panel  6. Primary insomnia Bed time routine  7. Acquired hypothyroidism - levothyroxine (SYNTHROID, LEVOTHROID) 100 MCG tablet; Take 1 tablet (100 mcg total) by mouth daily.  Dispense: 90 tablet; Refill: 1 - TSH  8. Arthritis - Arthritis Panel - meloxicam (MOBIC) 15 MG tablet; Take 1 tablet (15 mg total) by mouth daily.  Dispense: 30 tablet; Refill: 0 -Referral to rheumatology  Dionisio David, FNP student [place], FNP

## 2016-09-19 NOTE — Addendum Note (Signed)
Addended by: Rolena Infante on: 09/19/2016 01:57 PM   Modules accepted: Orders

## 2016-09-20 LAB — ARTHRITIS PANEL
BASOS ABS: 0.1 10*3/uL (ref 0.0–0.2)
Basos: 1 %
EOS (ABSOLUTE): 0.1 10*3/uL (ref 0.0–0.4)
EOS: 2 %
HEMATOCRIT: 38.5 % (ref 34.0–46.6)
Hemoglobin: 12.4 g/dL (ref 11.1–15.9)
IMMATURE GRANULOCYTES: 0 %
Immature Grans (Abs): 0 10*3/uL (ref 0.0–0.1)
LYMPHS ABS: 2.4 10*3/uL (ref 0.7–3.1)
LYMPHS: 28 %
MCH: 28.2 pg (ref 26.6–33.0)
MCHC: 32.2 g/dL (ref 31.5–35.7)
MCV: 88 fL (ref 79–97)
MONOCYTES: 7 %
MONOS ABS: 0.6 10*3/uL (ref 0.1–0.9)
Neutrophils Absolute: 5.5 10*3/uL (ref 1.4–7.0)
Neutrophils: 62 %
Platelets: 365 10*3/uL (ref 150–379)
RBC: 4.39 x10E6/uL (ref 3.77–5.28)
RDW: 13.6 % (ref 12.3–15.4)
Sed Rate: 5 mm/hr (ref 0–40)
Uric Acid: 7.7 mg/dL — ABNORMAL HIGH (ref 2.5–7.1)
WBC: 8.8 10*3/uL (ref 3.4–10.8)

## 2016-09-20 LAB — CMP14+EGFR
ALBUMIN: 4.7 g/dL (ref 3.6–4.8)
ALT: 41 IU/L — ABNORMAL HIGH (ref 0–32)
AST: 35 IU/L (ref 0–40)
Albumin/Globulin Ratio: 1.6 (ref 1.2–2.2)
Alkaline Phosphatase: 79 IU/L (ref 39–117)
BUN / CREAT RATIO: 13 (ref 12–28)
BUN: 13 mg/dL (ref 8–27)
Bilirubin Total: 0.4 mg/dL (ref 0.0–1.2)
CO2: 25 mmol/L (ref 18–29)
CREATININE: 0.97 mg/dL (ref 0.57–1.00)
Calcium: 9.7 mg/dL (ref 8.7–10.3)
Chloride: 96 mmol/L (ref 96–106)
GFR calc Af Amer: 70 mL/min/{1.73_m2} (ref >59)
GFR calc non Af Amer: 61 mL/min/{1.73_m2} (ref >59)
GLOBULIN, TOTAL: 3 g/dL (ref 1.5–4.5)
GLUCOSE: 88 mg/dL (ref 65–99)
Potassium: 4.1 mmol/L (ref 3.5–5.2)
SODIUM: 140 mmol/L (ref 134–144)
TOTAL PROTEIN: 7.7 g/dL (ref 6.0–8.5)

## 2016-09-20 LAB — LIPID PANEL
Chol/HDL Ratio: 3.5 ratio units (ref 0.0–4.4)
Cholesterol, Total: 177 mg/dL (ref 100–199)
HDL: 51 mg/dL (ref >39)
LDL CALC: 73 mg/dL (ref 0–99)
TRIGLYCERIDES: 264 mg/dL — AB (ref 0–149)
VLDL CHOLESTEROL CAL: 53 mg/dL — AB (ref 5–40)

## 2016-09-20 LAB — TSH: TSH: 2.12 u[IU]/mL (ref 0.450–4.500)

## 2016-09-22 ENCOUNTER — Other Ambulatory Visit: Payer: Self-pay | Admitting: Nurse Practitioner

## 2016-09-22 DIAGNOSIS — M1990 Unspecified osteoarthritis, unspecified site: Secondary | ICD-10-CM

## 2016-09-22 MED ORDER — MELOXICAM 15 MG PO TABS: 15.0000 mg | ORAL_TABLET | Freq: Every day | ORAL | 0 refills | Status: DC

## 2016-10-29 NOTE — Progress Notes (Signed)
Office Visit Note  Patient: Gabriela Gardner             Date of Birth: [birth year]           MRN: [id number]             PCP: Chevis Pretty, FNP Referring: Chevis Pretty, * Visit Date: 11/04/2016 Occupation: @GUAROCC @    Subjective:  Pain of the Right Hand; Pain of the Left Hand; and New Patient (Initial Visit)   History of Present Illness: Gabriela Gardner is a 66 y.o. female seen in consultation per request of her PCP. According to patient she's had history of carpal tunnel syndrome for the last 20 years which obvious cause some discomfort in her hands. She's never had surgery for carpal tunnel syndrome. She has noticed psoriasis on her bilateral elbows for the last 2 years and she uses over-the-counter agents. She's not seen a dermatologist. She states for the last 2 years she's been having increased pain in her bilateral hands which is been getting worse. Her hands always is still swollen. She is occasional discomfort in her feet. She denies discomfort in any other joints.   Activities of Daily Living:  Patient reports morning stiffness for all day hours.   Patient Reports nocturnal pain.  Difficulty dressing/grooming: Denies Difficulty climbing stairs: Denies Difficulty getting out of chair: Denies Difficulty using hands for taps, buttons, cutlery, and/or writing: Reports   Review of Systems  Constitutional: Negative for fatigue, night sweats, weight gain, weight loss and weakness.  HENT: Negative for mouth sores, trouble swallowing, trouble swallowing, mouth dryness and nose dryness.   Eyes: Negative for pain, redness, visual disturbance and dryness.  Respiratory: Negative for cough, shortness of breath and difficulty breathing.   Cardiovascular: Negative for chest pain, palpitations, hypertension, irregular heartbeat and swelling in legs/feet.  Gastrointestinal: Negative for blood in stool, constipation and diarrhea.  Endocrine: Negative for increased urination.    Genitourinary: Negative for vaginal dryness.  Musculoskeletal: Positive for arthralgias, joint pain, joint swelling and morning stiffness. Negative for myalgias, muscle weakness, muscle tenderness and myalgias.  Skin: Positive for rash. Negative for color change, hair loss, skin tightness, ulcers and sensitivity to sunlight.  Allergic/Immunologic: Negative for susceptible to infections.  Neurological: Negative for dizziness, memory loss and night sweats.  Hematological: Negative for swollen glands.  Psychiatric/Behavioral: Positive for sleep disturbance. Negative for depressed mood. The patient is not nervous/anxious.     PMFS History:  Patient Active Problem List   Diagnosis Date Noted  . History of hypothyroidism 10/30/2016  . History of hypertension 10/30/2016  . History of gastroesophageal reflux (GERD) 10/30/2016  . History of hyperlipidemia 10/30/2016  . BMI 28.0-28.9,adult 04/11/2015  . Gastroesophageal reflux disease without esophagitis 04/11/2015  . Arthritis 02/22/2014  . Insomnia 11/30/2013  . Hypertension 11/26/2012  . Hyperlipidemia 11/26/2012  . Hypothyroidism 11/26/2012    Past Medical History:  Diagnosis Date  . Hyperlipidemia   . Hypertension   . Skin cancer (melanoma) ([place])    left upper arm  . Thyroid disease     Family History  Problem Relation Age of Onset  . Adopted: Yes  . Family history unknown: Yes   Past Surgical History:  Procedure Laterality Date  . ABDOMINAL HYSTERECTOMY    . SKIN LESION EXCISION     Social History   Social History Narrative  . No narrative on file     Objective: Vital Signs: BP 132/70 (BP Location: Right Arm)   Pulse 66   Resp  14   Ht 5' 4.5" (1.638 m)   Wt 166 lb (75.3 kg)   BMI 28.05 kg/m    Physical Exam  Constitutional: She is oriented to person, place, and time. She appears well-developed and well-nourished.  HENT:  Head: Normocephalic and atraumatic.  Fullness in the parotid region bilaterally  Eyes:  Conjunctivae and EOM are normal.  Neck: Normal range of motion.  Cardiovascular: Normal rate, regular rhythm, normal heart sounds and intact distal pulses.   Pulmonary/Chest: Effort normal and breath sounds normal.  Abdominal: Soft. Bowel sounds are normal.  Lymphadenopathy:    She has no cervical adenopathy.  Neurological: She is alert and oriented to person, place, and time.  Skin: Skin is warm and dry. Capillary refill takes less than 2 seconds.  Psychiatric: She has a normal mood and affect. Her behavior is normal.  Nursing note and vitals reviewed.    Musculoskeletal Exam: C-spine and thoracic lumbar spine good range of motion. Shoulder joints elbow joints wrist joints are good range of motion. She had DIP PIP thickening bilaterally with some inflammatory change in the right first PIP joint. She has subluxation of her left first PIP. Hip joints knee joints ankles MTPs PIPs DIPs with good range of motion. She is some thickening of PIP/DIP joints in her feet. No SI joint tenderness was noted.  CDAI Exam: No CDAI exam completed.    Investigation: Findings:  09/19/2016 RF negative, Sed rate normal, Uric Acid 7.7, TSH normal, CMP shows ALT 41 otherwise normal, Lipid panel shows Triglycerides elevated 264, LDL 76    10/11/2015 negative Hep C Ab  CXR 04/11/2015 Mild interstitial prominence consistent with the patient's smoking history. There is no active cardiopulmonary disease.     Imaging: Xr Foot 2 Views Left  Result Date: 11/04/2016 First MTP, minimal PIP/DIP joint space narrowing was noted. No erosive changes were noted. Impression: Findings consistent with osteoarthritis  Xr Foot 2 Views Right  Result Date: 11/04/2016 First MTP, minimal PIP/DIP joint space narrowing was noted. No erosive changes were noted. Impression: Findings consistent with osteoarthritis  Xr Hand 2 View Left  Result Date: 11/04/2016 PIP/DIP narrowing was noted. No erosive changes were noted. No MCP joint  narrowing or erosive changes noted. Severe CMC arthritis noted. No intercarpal joint or radiocarpal joint space narrowing was noted. Impression these findings would be consistent with osteoarthritis or psoriatic arthritis  Xr Hand 2 View Right  Result Date: 11/04/2016 All MCP joints appear normal without any erosive changes she has all PIP/DIP narrowing and spurring in the right first PIP joint no erosive changes were noted. Right CMC joint narrowing was noted. No intercarpal joint or radiocarpal joint space narrowing was noted. These findings could be consistent with osteoarthritis psoriatic arthritis can be in the differential. She could not take the rings off her fingers.   Speciality Comments: No specialty comments available.    Procedures:  No procedures performed Allergies: Codeine   Assessment / Plan:     Visit Diagnoses: Pain in both hands - she has severe osteoarthritis with mild inflammatory change in her right first PIP joint. RF negative, ESR normal - Plan: XR Hand 2 View Right, XR Hand 2 View Left. X-ray revealed osteoarthritic changes in all DIP, PIP and CMC joints. No erosive changes were noted.  Pain in both feet - Plan: XR Foot 2 Views Right, XR Foot 2 Views Left. X-rays were consistent with osteoarthritis.  Psoriasis: Questionable she has dry patches over bilateral elbows. I will refer  her to dermatology to establish the diagnosis.  Hyperuricemia - Uric acid 7.7. Patient is asymptomatic  History of hyperlipidemia  History of gastroesophageal reflux (GERD)  History of hypertension  History of hypothyroidism  Primary insomnia: She has insomnia but she also has nocturnal pain in her hands.  Rash and other nonspecific skin eruption - Plan: Ambulatory referral to Dermatology    Orders: Orders Placed This Encounter  Procedures  . XR Hand 2 View Right  . XR Hand 2 View Left  . XR Foot 2 Views Right  . XR Foot 2 Views Left  . Ambulatory referral to Dermatology    No orders of the defined types were placed in this encounter.   Face-to-face time spent with patient was 50 minutes. 50% of time was spent in counseling and coordination of care.  Follow-Up Instructions: Return for Pain hands and feet.   Bo Merino, MD  Note - This record has been created using Editor, commissioning.  Chart creation errors have been sought, but may not always  have been located. Such creation errors do not reflect on  the standard of medical care.

## 2016-10-30 DIAGNOSIS — Z8679 Personal history of other diseases of the circulatory system: Secondary | ICD-10-CM | POA: Insufficient documentation

## 2016-10-30 DIAGNOSIS — Z8719 Personal history of other diseases of the digestive system: Secondary | ICD-10-CM | POA: Insufficient documentation

## 2016-10-30 DIAGNOSIS — Z8639 Personal history of other endocrine, nutritional and metabolic disease: Secondary | ICD-10-CM | POA: Insufficient documentation

## 2016-11-04 ENCOUNTER — Ambulatory Visit (INDEPENDENT_AMBULATORY_CARE_PROVIDER_SITE_OTHER): Payer: Medicare Other

## 2016-11-04 ENCOUNTER — Encounter: Payer: Self-pay | Admitting: Rheumatology

## 2016-11-04 ENCOUNTER — Ambulatory Visit (INDEPENDENT_AMBULATORY_CARE_PROVIDER_SITE_OTHER): Payer: Medicare Other | Admitting: Rheumatology

## 2016-11-04 VITALS — BP 132/70 | HR 66 | Resp 14 | Ht 64.5 in | Wt 166.0 lb

## 2016-11-04 DIAGNOSIS — M79642 Pain in left hand: Secondary | ICD-10-CM

## 2016-11-04 DIAGNOSIS — L409 Psoriasis, unspecified: Secondary | ICD-10-CM | POA: Diagnosis not present

## 2016-11-04 DIAGNOSIS — Z8679 Personal history of other diseases of the circulatory system: Secondary | ICD-10-CM

## 2016-11-04 DIAGNOSIS — M79671 Pain in right foot: Secondary | ICD-10-CM

## 2016-11-04 DIAGNOSIS — E790 Hyperuricemia without signs of inflammatory arthritis and tophaceous disease: Secondary | ICD-10-CM

## 2016-11-04 DIAGNOSIS — R21 Rash and other nonspecific skin eruption: Secondary | ICD-10-CM

## 2016-11-04 DIAGNOSIS — Z8639 Personal history of other endocrine, nutritional and metabolic disease: Secondary | ICD-10-CM | POA: Diagnosis not present

## 2016-11-04 DIAGNOSIS — Z8719 Personal history of other diseases of the digestive system: Secondary | ICD-10-CM

## 2016-11-04 DIAGNOSIS — M79672 Pain in left foot: Secondary | ICD-10-CM

## 2016-11-04 DIAGNOSIS — M79641 Pain in right hand: Secondary | ICD-10-CM

## 2016-11-04 DIAGNOSIS — F5101 Primary insomnia: Secondary | ICD-10-CM | POA: Diagnosis not present

## 2016-11-19 ENCOUNTER — Other Ambulatory Visit: Payer: Self-pay | Admitting: Nurse Practitioner

## 2016-11-19 DIAGNOSIS — M1990 Unspecified osteoarthritis, unspecified site: Secondary | ICD-10-CM

## 2016-11-19 DIAGNOSIS — I10 Essential (primary) hypertension: Secondary | ICD-10-CM

## 2016-12-02 DIAGNOSIS — L821 Other seborrheic keratosis: Secondary | ICD-10-CM | POA: Diagnosis not present

## 2016-12-02 DIAGNOSIS — Z85820 Personal history of malignant melanoma of skin: Secondary | ICD-10-CM | POA: Diagnosis not present

## 2016-12-02 DIAGNOSIS — D225 Melanocytic nevi of trunk: Secondary | ICD-10-CM | POA: Diagnosis not present

## 2016-12-02 DIAGNOSIS — L309 Dermatitis, unspecified: Secondary | ICD-10-CM | POA: Diagnosis not present

## 2016-12-02 DIAGNOSIS — L738 Other specified follicular disorders: Secondary | ICD-10-CM | POA: Diagnosis not present

## 2016-12-05 ENCOUNTER — Ambulatory Visit: Payer: Medicare Other | Admitting: Rheumatology

## 2016-12-09 ENCOUNTER — Ambulatory Visit: Payer: Medicare Other | Admitting: Rheumatology

## 2016-12-15 NOTE — Progress Notes (Deleted)
Assessment / Plan:     Visit Diagnoses: Pain in both hands - she has severe osteoarthritis with mild inflammatory change in her right first PIP joint. RF negative, ESR normal - Plan: XR Hand 2 View Right, XR Hand 2 View Left. X-ray revealed osteoarthritic changes in all DIP, PIP and CMC joints. No erosive changes were noted.  Pain in both feet - Plan: XR Foot 2 Views Right, XR Foot 2 Views Left. X-rays were consistent with osteoarthritis.  Psoriasis: Questionable she has dry patches over bilateral elbows. I will refer her to dermatology to establish the diagnosis.  Hyperuricemia - Uric acid 7.7. Patient is asymptomatic  History of hyperlipidemia  History of gastroesophageal reflux (GERD)  History of hypertension  History of hypothyroidism  Primary insomnia: She has insomnia but she also has nocturnal pain in her hands.  Rash and other nonspecific skin eruption - Plan: Ambulatory referral to Dermatology

## 2016-12-17 ENCOUNTER — Ambulatory Visit: Payer: Medicare Other | Admitting: Rheumatology

## 2016-12-23 ENCOUNTER — Ambulatory Visit: Payer: Medicare Other | Admitting: Rheumatology

## 2017-01-09 ENCOUNTER — Ambulatory Visit (INDEPENDENT_AMBULATORY_CARE_PROVIDER_SITE_OTHER): Payer: Medicare Other | Admitting: Nurse Practitioner

## 2017-01-09 ENCOUNTER — Encounter: Payer: Self-pay | Admitting: Nurse Practitioner

## 2017-01-09 VITALS — BP 116/73 | HR 68 | Temp 97.4°F | Ht 64.0 in | Wt 161.0 lb

## 2017-01-09 DIAGNOSIS — S39012A Strain of muscle, fascia and tendon of lower back, initial encounter: Secondary | ICD-10-CM | POA: Diagnosis not present

## 2017-01-09 DIAGNOSIS — M1990 Unspecified osteoarthritis, unspecified site: Secondary | ICD-10-CM | POA: Diagnosis not present

## 2017-01-09 MED ORDER — CYCLOBENZAPRINE HCL 5 MG PO TABS: 5.0000 mg | ORAL_TABLET | Freq: Three times a day (TID) | ORAL | 2 refills | Status: DC | PRN

## 2017-01-09 MED ORDER — MELOXICAM 15 MG PO TABS: 15.0000 mg | ORAL_TABLET | Freq: Every day | ORAL | 0 refills | Status: DC

## 2017-01-09 NOTE — Patient Instructions (Signed)

## 2017-01-09 NOTE — Addendum Note (Signed)
Addended by: Chevis Pretty on: 01/09/2017 09:32 AM   Modules accepted: Orders

## 2017-01-09 NOTE — Progress Notes (Signed)
   Subjective:    Patient ID: Gabriela Gardner, female    DOB: 11-18-1950, 66 y.o.   MRN: [id number]  HPI Patient in the office today complaining of back pain that has been present for the past 3 weeks.  She states it is better than it was, but still very uncomfortable.  Patient was doing some heavy lifting when the pain started.  Patient has been taking Tylenol for the pain and this helps some.  She has been resting as needed.  She states that one week ago she was really having trouble and could not get out of the chair.  Pain is described as sharp early on, especially with sitting, and pain is aching as the day progresses.  Pain is rated as 2-3/10 in the office and 10/10 at home when at its worst.     Review of Systems  Gastrointestinal:       Heart burn and gas increased in the last week   Musculoskeletal: Positive for back pain (x3 weeks) and myalgias (bilateral lower back). Negative for gait problem.  Neurological: Negative for dizziness and light-headedness.  All other systems reviewed and are negative.      Objective:   Physical Exam  Constitutional: She appears well-developed and well-nourished. No distress.  Neck: Normal range of motion.  Cardiovascular: Normal rate, regular rhythm and normal heart sounds.   Pulmonary/Chest: Effort normal and breath sounds normal.  Musculoskeletal: Normal range of motion. She exhibits no edema.       Lumbar back: She exhibits tenderness (bilateral) and pain. She exhibits normal range of motion, no edema, no deformity and no spasm.   BP 116/73   Pulse 68   Temp (!) 97.4 F (36.3 C) (Oral)   Ht 5\' 4"  (1.626 m)   Wt 161 lb (73 kg)   BMI 27.64 kg/m      Assessment & Plan:  1. Back strain, initial encounter Back stretching exercises Moist heat BID Continue mobic as rx Sedation precautions with baclofen RTO prn Meds ordered this encounter  Medications  . cyclobenzaprine (FLEXERIL) 5 MG tablet    Sig: Take 1 tablet (5 mg total) by mouth 3  (three) times daily as needed for muscle spasms.    Dispense:  30 tablet    Refill:  2    Order Specific Question:   Supervising Provider    Answer:   Eustaquio Maize [[id number]]   Mary-Margaret Hassell Done, FNP

## 2017-01-12 ENCOUNTER — Other Ambulatory Visit: Payer: Self-pay | Admitting: Nurse Practitioner

## 2017-01-12 DIAGNOSIS — M1990 Unspecified osteoarthritis, unspecified site: Secondary | ICD-10-CM

## 2017-03-06 NOTE — Progress Notes (Deleted)
Assessment / Plan:     Visit Diagnoses: Pain in both hands - she has severe osteoarthritis with mild inflammatory change in her right first PIP joint. RF negative, ESR normal - Plan: XR Hand 2 View Right, XR Hand 2 View Left. X-ray revealed osteoarthritic changes in all DIP, PIP and CMC joints. No erosive changes were noted.  Pain in both feet - Plan: XR Foot 2 Views Right, XR Foot 2 Views Left. X-rays were consistent with osteoarthritis.  Psoriasis: Questionable she has dry patches over bilateral elbows. I will refer her to dermatology to establish the diagnosis.  Hyperuricemia - Uric acid 7.7. Patient is asymptomatic  History of hyperlipidemia  History of gastroesophageal reflux (GERD)  History of hypertension  History of hypothyroidism  Primary insomnia: She has insomnia but she also has nocturnal pain in her hands.  Rash and other nonspecific skin eruption - Plan: Ambulatory referral to Dermatology ( notes from Dr Ronnald Ramp in Media tab, elbow dermatitis / he did not want to biopsy elbows, wants to watch will biopsy if rash occurs at another location)

## 2017-03-11 ENCOUNTER — Other Ambulatory Visit: Payer: Medicare Other | Admitting: Rheumatology

## 2017-03-13 DIAGNOSIS — Z23 Encounter for immunization: Secondary | ICD-10-CM | POA: Diagnosis not present

## 2017-03-13 NOTE — Progress Notes (Signed)
   Ultrasound examination today revealed osteoarthritis. No synovitis was noted. Her bilateral median nerves were enlarged. She states she's been diagnosed carpal tunnel syndrome in the past but she is not symptomatic currently.  Joint protection and muscle strengthening was discussed. She will return for follow-up on when necessary basis. Patient to notify me if she develops any new symptoms.  Bo Merino, MD

## 2017-03-18 ENCOUNTER — Inpatient Hospital Stay (INDEPENDENT_AMBULATORY_CARE_PROVIDER_SITE_OTHER): Payer: Self-pay

## 2017-03-18 ENCOUNTER — Ambulatory Visit (INDEPENDENT_AMBULATORY_CARE_PROVIDER_SITE_OTHER): Payer: Medicare Other | Admitting: Rheumatology

## 2017-03-18 ENCOUNTER — Ambulatory Visit: Payer: Medicare Other | Admitting: Rheumatology

## 2017-03-18 DIAGNOSIS — M79642 Pain in left hand: Secondary | ICD-10-CM | POA: Diagnosis not present

## 2017-03-18 DIAGNOSIS — M79641 Pain in right hand: Secondary | ICD-10-CM | POA: Diagnosis not present

## 2017-03-30 ENCOUNTER — Other Ambulatory Visit: Payer: Self-pay | Admitting: Nurse Practitioner

## 2017-03-30 DIAGNOSIS — I10 Essential (primary) hypertension: Secondary | ICD-10-CM

## 2017-04-03 ENCOUNTER — Ambulatory Visit: Payer: Medicare Other | Admitting: Rheumatology

## 2017-05-17 ENCOUNTER — Other Ambulatory Visit: Payer: Self-pay | Admitting: Nurse Practitioner

## 2017-05-17 DIAGNOSIS — M1990 Unspecified osteoarthritis, unspecified site: Secondary | ICD-10-CM

## 2017-05-17 DIAGNOSIS — E7800 Pure hypercholesterolemia, unspecified: Secondary | ICD-10-CM

## 2017-05-17 DIAGNOSIS — I10 Essential (primary) hypertension: Secondary | ICD-10-CM

## 2017-05-17 DIAGNOSIS — K219 Gastro-esophageal reflux disease without esophagitis: Secondary | ICD-10-CM

## 2017-05-17 DIAGNOSIS — E039 Hypothyroidism, unspecified: Secondary | ICD-10-CM

## 2017-06-18 ENCOUNTER — Telehealth: Payer: Self-pay | Admitting: Nurse Practitioner

## 2017-09-07 ENCOUNTER — Encounter: Payer: Self-pay | Admitting: Nurse Practitioner

## 2017-09-07 ENCOUNTER — Ambulatory Visit (INDEPENDENT_AMBULATORY_CARE_PROVIDER_SITE_OTHER): Payer: Medicare HMO | Admitting: Nurse Practitioner

## 2017-09-07 VITALS — BP 121/71 | HR 84 | Temp 97.0°F | Ht 64.0 in | Wt 160.0 lb

## 2017-09-07 DIAGNOSIS — R142 Eructation: Secondary | ICD-10-CM

## 2017-09-07 DIAGNOSIS — R531 Weakness: Secondary | ICD-10-CM | POA: Diagnosis not present

## 2017-09-07 DIAGNOSIS — R5383 Other fatigue: Secondary | ICD-10-CM | POA: Diagnosis not present

## 2017-09-07 DIAGNOSIS — R7989 Other specified abnormal findings of blood chemistry: Secondary | ICD-10-CM | POA: Diagnosis not present

## 2017-09-07 NOTE — Patient Instructions (Signed)

## 2017-09-07 NOTE — Progress Notes (Signed)
   Subjective:    Patient ID: Gabriela Gardner, female    DOB: 05/30/1951, 67 y.o.   MRN: [id number]  HPI Patient brought in by her husband. One week ago this past Saturday she had some vomiting and thought it was just something she ate. She as had no real appetiie since then and belching a lot and the smell of food make her sick. She also has not felt like doing anything. Just stays tired and wants to lay around. She denies depression.    Review of Systems  Constitutional: Positive for appetite change (decreased) and chills (intermittent). Negative for fever.  HENT: Negative for congestion, ear discharge, rhinorrhea, sinus pain, sore throat and trouble swallowing.   Respiratory: Negative for cough.   Cardiovascular: Negative.   Gastrointestinal: Negative for abdominal pain, constipation, diarrhea, nausea and vomiting.  Genitourinary: Negative.   Musculoskeletal: Negative.   Skin: Negative.   Neurological: Positive for weakness. Negative for dizziness and headaches.  Psychiatric/Behavioral: Negative.   All other systems reviewed and are negative.      Objective:   Physical Exam  Constitutional: She is oriented to person, place, and time. She appears well-developed and well-nourished. No distress.  HENT:  Right Ear: External ear normal.  Left Ear: External ear normal.  Nose: Nose normal.  Mouth/Throat: Oropharynx is clear and moist.  Neck: Normal range of motion. Neck supple.  Cardiovascular: Normal rate and regular rhythm.  Pulmonary/Chest: Effort normal and breath sounds normal.  Abdominal: Soft. Bowel sounds are normal.  Musculoskeletal: Normal range of motion.  Neurological: She is alert and oriented to person, place, and time. She has normal reflexes.  Skin: Skin is warm and dry.  Psychiatric: She has a normal mood and affect. Her behavior is normal. Thought content normal.   BP 121/71   Pulse 84   Temp (!) 97 F (36.1 C) (Oral)   Ht _0  (1.626 m)   Wt 160 lb (72.6 kg)    BMI 27.46 kg/m         Assessment & Plan:   1. Other fatigue   2. Belching    Labs pending Orders Placed This Encounter  Procedures  . Anemia Profile B  . CMP14+EGFR  . Thyroid Panel With TSH  . H Pylori, IGM, IGG, IGA AB   Will call with lab results Rest Force fluids RTO prn  Mary-Margaret Hassell Done, FNP

## 2017-09-08 ENCOUNTER — Other Ambulatory Visit: Payer: Self-pay | Admitting: Nurse Practitioner

## 2017-09-08 DIAGNOSIS — R7989 Other specified abnormal findings of blood chemistry: Secondary | ICD-10-CM

## 2017-09-08 LAB — ANEMIA PROFILE B
BASOS: 0 %
Basophils Absolute: 0 10*3/uL (ref 0.0–0.2)
EOS (ABSOLUTE): 0.1 10*3/uL (ref 0.0–0.4)
EOS: 1 %
FERRITIN: 120 ng/mL (ref 15–150)
Folate: >20 ng/mL (ref >3.0)
HEMATOCRIT: 32.6 % — AB (ref 34.0–46.6)
HEMOGLOBIN: 10.3 g/dL — AB (ref 11.1–15.9)
IMMATURE GRANS (ABS): 0 10*3/uL (ref 0.0–0.1)
IRON SATURATION: 6 % — AB (ref 15–55)
Immature Granulocytes: 0 %
Iron: 18 ug/dL — ABNORMAL LOW (ref 27–139)
Lymphocytes Absolute: 1.1 10*3/uL (ref 0.7–3.1)
Lymphs: 10 %
MCH: 26 pg — ABNORMAL LOW (ref 26.6–33.0)
MCHC: 31.6 g/dL (ref 31.5–35.7)
MCV: 82 fL (ref 79–97)
MONOS ABS: 0.6 10*3/uL (ref 0.1–0.9)
Monocytes: 6 %
NEUTROS ABS: 9 10*3/uL — AB (ref 1.4–7.0)
Neutrophils: 83 %
Platelets: 623 10*3/uL — ABNORMAL HIGH (ref 150–379)
RBC: 3.96 x10E6/uL (ref 3.77–5.28)
RDW: 15.4 % (ref 12.3–15.4)
RETIC CT PCT: 1.1 % (ref 0.6–2.6)
Total Iron Binding Capacity: 307 ug/dL (ref 250–450)
UIBC: 289 ug/dL (ref 118–369)
VITAMIN B 12: 1162 pg/mL (ref 232–1245)
WBC: 10.8 10*3/uL (ref 3.4–10.8)

## 2017-09-08 LAB — THYROID PANEL WITH TSH
FREE THYROXINE INDEX: 1.9 (ref 1.2–4.9)
T3 Uptake Ratio: 21 % — ABNORMAL LOW (ref 24–39)
T4 TOTAL: 9 ug/dL (ref 4.5–12.0)
TSH: 4.4 u[IU]/mL (ref 0.450–4.500)

## 2017-09-08 LAB — CMP14+EGFR
ALT: 73 IU/L — ABNORMAL HIGH (ref 0–32)
AST: 53 IU/L — AB (ref 0–40)
Albumin/Globulin Ratio: 1.2 (ref 1.2–2.2)
Albumin: 4.1 g/dL (ref 3.6–4.8)
Alkaline Phosphatase: 149 IU/L — ABNORMAL HIGH (ref 39–117)
BUN/Creatinine Ratio: 16 (ref 12–28)
BUN: 19 mg/dL (ref 8–27)
Bilirubin Total: 0.3 mg/dL (ref 0.0–1.2)
CHLORIDE: 93 mmol/L — AB (ref 96–106)
CO2: 23 mmol/L (ref 20–29)
CREATININE: 1.18 mg/dL — AB (ref 0.57–1.00)
Calcium: 9.3 mg/dL (ref 8.7–10.3)
GFR calc Af Amer: 55 mL/min/{1.73_m2} — ABNORMAL LOW (ref >59)
GFR calc non Af Amer: 48 mL/min/{1.73_m2} — ABNORMAL LOW (ref >59)
Globulin, Total: 3.5 g/dL (ref 1.5–4.5)
Glucose: 91 mg/dL (ref 65–99)
Potassium: 4.2 mmol/L (ref 3.5–5.2)
Sodium: 138 mmol/L (ref 134–144)
Total Protein: 7.6 g/dL (ref 6.0–8.5)

## 2017-09-08 LAB — H PYLORI, IGM, IGG, IGA AB
H pylori, IgM Abs: <9 units (ref 0.0–8.9)
H. pylori, IgA Abs: <9 units (ref 0.0–8.9)

## 2017-09-08 NOTE — Progress Notes (Signed)
Ref hematology  

## 2017-09-09 ENCOUNTER — Telehealth: Payer: Self-pay | Admitting: Nurse Practitioner

## 2017-09-09 NOTE — Telephone Encounter (Signed)
Aware of results. 

## 2017-09-29 ENCOUNTER — Inpatient Hospital Stay (HOSPITAL_COMMUNITY): Payer: Medicare HMO

## 2017-09-29 ENCOUNTER — Inpatient Hospital Stay (HOSPITAL_COMMUNITY): Payer: Medicare HMO | Attending: Hematology | Admitting: Hematology

## 2017-09-29 ENCOUNTER — Encounter (HOSPITAL_COMMUNITY): Payer: Self-pay | Admitting: Hematology

## 2017-09-29 VITALS — BP 155/70 | HR 72 | Temp 98.0°F | Resp 16 | Ht 64.0 in | Wt 164.4 lb

## 2017-09-29 DIAGNOSIS — D473 Essential (hemorrhagic) thrombocythemia: Secondary | ICD-10-CM | POA: Diagnosis not present

## 2017-09-29 DIAGNOSIS — I10 Essential (primary) hypertension: Secondary | ICD-10-CM | POA: Diagnosis not present

## 2017-09-29 DIAGNOSIS — D75839 Thrombocytosis, unspecified: Secondary | ICD-10-CM

## 2017-09-29 DIAGNOSIS — E785 Hyperlipidemia, unspecified: Secondary | ICD-10-CM

## 2017-09-29 DIAGNOSIS — D649 Anemia, unspecified: Secondary | ICD-10-CM

## 2017-09-29 LAB — CBC WITH DIFFERENTIAL/PLATELET
BASOS PCT: 0 %
Basophils Absolute: 0 10*3/uL (ref 0.0–0.1)
EOS ABS: 0.3 10*3/uL (ref 0.0–0.7)
Eosinophils Relative: 3 %
HCT: 35.4 % — ABNORMAL LOW (ref 36.0–46.0)
Hemoglobin: 10.8 g/dL — ABNORMAL LOW (ref 12.0–15.0)
LYMPHS ABS: 1.1 10*3/uL (ref 0.7–4.0)
Lymphocytes Relative: 13 %
MCH: 25.7 pg — AB (ref 26.0–34.0)
MCHC: 30.5 g/dL (ref 30.0–36.0)
MCV: 84.3 fL (ref 78.0–100.0)
MONO ABS: 0.6 10*3/uL (ref 0.1–1.0)
MONOS PCT: 8 %
NEUTROS PCT: 76 %
Neutro Abs: 6.4 10*3/uL (ref 1.7–7.7)
PLATELETS: 325 10*3/uL (ref 150–400)
RBC: 4.2 MIL/uL (ref 3.87–5.11)
RDW: 15.9 % — ABNORMAL HIGH (ref 11.5–15.5)
WBC: 8.5 10*3/uL (ref 4.0–10.5)

## 2017-09-29 LAB — C-REACTIVE PROTEIN

## 2017-09-29 NOTE — Progress Notes (Signed)
CONSULT NOTE  Patient Care Team: Chevis Pretty, FNP as PCP - General (Nurse Practitioner)  CHIEF COMPLAINTS/PURPOSE OF CONSULTATION:  Elevated platelet count.  HISTORY OF PRESENTING ILLNESS:  Gabriela Gardner 67 y.o. female is seen in consultation today for a further workup of elevated platelet count.  Her recent CBC dated 09/07/2017 showed platelet count of 623.  This was 365k 1 year prior.  She does not have any fevers, night sweats or weight loss.  She denies any bleeding per rectum or melena.  She has a few right sided headaches.  She denies any vasomotor symptoms or erythromelalgia.  No itching was reported after hot showers.  She denies any history of connective tissue disorders.  She denies any recent hospitalizations or surgeries.  She reports that she had a dinner in a restaurant on 08/29/2017, went home and vomited.  From 08/30/2017, she noticed a drastic drop in her energy levels which is sudden.  They have improved slightly but she never felt like she is on 100%.  She does not report any other symptoms.   MEDICAL HISTORY:  Past Medical History:  Diagnosis Date  . Hyperlipidemia   . Hypertension   . Skin cancer (melanoma) ([place])    left upper arm  . Thyroid disease     SURGICAL HISTORY: Past Surgical History:  Procedure Laterality Date  . ABDOMINAL HYSTERECTOMY    . SKIN LESION EXCISION      SOCIAL HISTORY: Social History   Socioeconomic History  . Marital status: Married    Spouse name: Not on file  . Number of children: Not on file  . Years of education: Not on file  . Highest education level: Not on file  Occupational History  . Not on file  Social Needs  . Financial resource strain: Not on file  . Food insecurity:    Worry: Not on file    Inability: Not on file  . Transportation needs:    Medical: Not on file    Non-medical: Not on file  Tobacco Use  . Smoking status: Former Smoker    Packs/day: 1.00    Years: 30.00    Pack years: 30.00    Types:  Cigarettes    Last attempt to quit: 06/30/2006    Years since quitting: 11.2  . Smokeless tobacco: Never Used  Substance and Sexual Activity  . Alcohol use: Yes    Comment: occasional  . Drug use: No  . Sexual activity: Yes    Birth control/protection: Post-menopausal  Lifestyle  . Physical activity:    Days per week: Not on file    Minutes per session: Not on file  . Stress: Not on file  Relationships  . Social connections:    Talks on phone: Not on file    Gets together: Not on file    Attends religious service: Not on file    Active member of club or organization: Not on file    Attends meetings of clubs or organizations: Not on file    Relationship status: Not on file  . Intimate partner violence:    Fear of current or ex partner: Not on file    Emotionally abused: Not on file    Physically abused: Not on file    Forced sexual activity: Not on file  Other Topics Concern  . Not on file  Social History Narrative  . Not on file    FAMILY HISTORY: Family History  Adopted: Yes  Problem Relation Age of Onset  .  Asthma Son   . Turner syndrome Daughter     ALLERGIES:  is allergic to codeine.  MEDICATIONS:  Current Outpatient Medications  Medication Sig Dispense Refill  . acetaminophen (TYLENOL) 500 MG tablet Take 500 mg by mouth every 6 (six) hours as needed for pain.    Marland Kitchen amLODipine (NORVASC) 5 MG tablet TAKE 1 TABLET EVERY DAY 90 tablet 0  . atorvastatin (LIPITOR) 40 MG tablet TAKE 1 TABLET EVERY DAY (NEED TO BE SEEN) 90 tablet 0  . calcium carbonate (OS-CAL) 600 MG TABS Take 600 mg by mouth daily.    Marland Kitchen levothyroxine (SYNTHROID, LEVOTHROID) 100 MCG tablet TAKE 1 TABLET EVERY DAY 90 tablet 1  . lisinopril-hydrochlorothiazide (PRINZIDE,ZESTORETIC) 20-12.5 MG tablet TAKE 2 TABLETS EVERY DAY (NEED MD APPOINTMENT) 180 tablet 1  . meloxicam (MOBIC) 15 MG tablet TAKE 1 TABLET EVERY DAY 90 tablet 0  . Multiple Vitamin (MULTIVITAMIN) tablet Take 1 tablet by mouth daily.    Marland Kitchen  omeprazole (PRILOSEC) 40 MG capsule TAKE 1 CAPSULE EVERY DAY  (NEED MD APPOINTMENT FOR REFILLS) 90 capsule 0   No current facility-administered medications for this visit.     REVIEW OF SYSTEMS:   Constitutional: Denies fevers, chills or abnormal night sweats.  She has new onset fatigue from 08/30/2017. Eyes: Denies blurriness of vision, double vision or watery eyes she does have occasional headache. Ears, nose, mouth, throat, and face: Denies mucositis or sore throat Respiratory: Denies cough, dyspnea or wheezes Cardiovascular: Denies palpitation, chest discomfort or lower extremity swelling Gastrointestinal:  Denies nausea, heartburn or change in bowel habits Skin: Denies abnormal skin rashes Lymphatics: Denies new lymphadenopathy or easy bruising Neurological:Denies numbness, tingling or new weaknesses Behavioral/Psych: Mood is stable, no new changes  All other systems were reviewed with the patient and are negative.  PHYSICAL EXAMINATION: ECOG PERFORMANCE STATUS: 1 - Symptomatic but completely ambulatory   GENERAL:alert, no distress and comfortable SKIN: skin color, texture, turgor are normal, no rashes or significant lesions EYES: normal, conjunctiva are pink and non-injected, sclera clear OROPHARYNX:no exudate, no erythema and lips, buccal mucosa, and tongue normal  NECK: supple, thyroid normal size, non-tender, without nodularity LYMPH:  no palpable lymphadenopathy in the cervical, axillary or inguinal LUNGS: clear to auscultation and percussion with normal breathing effort HEART: regular rate & rhythm and no murmurs and no lower extremity edema ABDOMEN:abdomen soft, non-tender and normal bowel sounds Musculoskeletal:no cyanosis of digits and no clubbing  PSYCH: alert & oriented x 3 with fluent speech NEURO: no focal motor/sensory deficits  LABORATORY DATA:  I have reviewed the data as listed Recent Results (from the past 2160 hour(s))  Anemia Profile B     Status: Abnormal    Collection Time: 09/07/17  4:01 PM  Result Value Ref Range   Total Iron Binding Capacity 307 250 - 450 ug/dL   UIBC 289 118 - 369 ug/dL   Iron 18 (L) 27 - 139 ug/dL   Iron Saturation 6 (LL) 15 - 55 %   Ferritin 120 15 - 150 ng/mL   Vitamin B-12 1,162 232 - 1,245 pg/mL   Folate >20.0 >3.0 ng/mL    Comment: A serum folate concentration of less than 3.1 ng/mL is considered to represent clinical deficiency.    WBC 10.8 3.4 - 10.8 x10E3/uL   RBC 3.96 3.77 - 5.28 x10E6/uL   Hemoglobin 10.3 (L) 11.1 - 15.9 g/dL   Hematocrit 32.6 (L) 34.0 - 46.6 %   MCV 82 79 - 97 fL   MCH 26.0 (  L) 26.6 - 33.0 pg   MCHC 31.6 31.5 - 35.7 g/dL   RDW 15.4 12.3 - 15.4 %   Platelets 623 (H) 150 - 379 x10E3/uL   Neutrophils 83 Not Estab. %   Lymphs 10 Not Estab. %   Monocytes 6 Not Estab. %   Eos 1 Not Estab. %   Basos 0 Not Estab. %   Neutrophils Absolute 9.0 (H) 1.4 - 7.0 x10E3/uL   Lymphocytes Absolute 1.1 0.7 - 3.1 x10E3/uL   Monocytes Absolute 0.6 0.1 - 0.9 x10E3/uL   EOS (ABSOLUTE) 0.1 0.0 - 0.4 x10E3/uL   Basophils Absolute 0.0 0.0 - 0.2 x10E3/uL   Immature Granulocytes 0 Not Estab. %   Immature Grans (Abs) 0.0 0.0 - 0.1 x10E3/uL   Retic Ct Pct 1.1 0.6 - 2.6 %  CMP14+EGFR     Status: Abnormal   Collection Time: 09/07/17  4:01 PM  Result Value Ref Range   Glucose 91 65 - 99 mg/dL   BUN 19 8 - 27 mg/dL   Creatinine, Ser 1.18 (H) 0.57 - 1.00 mg/dL   GFR calc non Af Amer 48 (L) >59 mL/min/1.73   GFR calc Af Amer 55 (L) >59 mL/min/1.73   BUN/Creatinine Ratio 16 12 - 28   Sodium 138 134 - 144 mmol/L   Potassium 4.2 3.5 - 5.2 mmol/L   Chloride 93 (L) 96 - 106 mmol/L   CO2 23 20 - 29 mmol/L   Calcium 9.3 8.7 - 10.3 mg/dL   Total Protein 7.6 6.0 - 8.5 g/dL   Albumin 4.1 3.6 - 4.8 g/dL   Globulin, Total 3.5 1.5 - 4.5 g/dL   Albumin/Globulin Ratio 1.2 1.2 - 2.2   Bilirubin Total 0.3 0.0 - 1.2 mg/dL   Alkaline Phosphatase 149 (H) 39 - 117 IU/L   AST 53 (H) 0 - 40 IU/L   ALT 73 (H) 0 - 32 IU/L   Thyroid Panel With TSH     Status: Abnormal   Collection Time: 09/07/17  4:01 PM  Result Value Ref Range   TSH 4.400 0.450 - 4.500 uIU/mL   T4, Total 9.0 4.5 - 12.0 ug/dL   T3 Uptake Ratio 21 (L) 24 - 39 %   Free Thyroxine Index 1.9 1.2 - 4.9  H Pylori, IGM, IGG, IGA AB     Status: None   Collection Time: 09/07/17  4:01 PM  Result Value Ref Range   H. pylori, IgG AbS <0.80 0.00 - 0.79 Index Value    Comment:                              Negative           <0.80                              Equivocal    0.80 - 0.89                              Positive           >0.89    H. pylori, IgA Abs <9.0 0.0 - 8.9 units    Comment:                                 Negative          <  9.0                                 Equivocal   9.0 - 11.0                                 Positive         >11.0    H pylori, IgM Abs <9.0 0.0 - 8.9 units    Comment:                                 Negative          <9.0                                 Equivocal   9.0 - 11.0                                 Positive         >11.0 This test was developed and its performance characteristics determined by [facility]. It has not been cleared or approved by the Food and Drug Administration.     RADIOGRAPHIC STUDIES: Her last mammogram in August 2016 was within normal limits.  ASSESSMENT & PLAN:  Thrombocytosis ([place]) 1.  Thrombocytosis: She was found to have an elevated platelet count of 623 on a CBC dated 09/07/2017.  Prior to that her platelet count in March 2018 was 365.  Patient also reports having fatigue, sudden onset after eating food at a restaurant on 08/29/2017.  The tiredness has slightly improved.  She had an episode of vomiting after eating on that day.  She does not have any B symptoms.  She does not have any palpable adenopathy or splenomegaly.  She has related to iron deficiency state with a low percent saturation of 6 on her iron panel done recently.  She had a mildly elevated creatinine of 1.18.  I plan to  repeat a CBC with differential and review of smear.  We will also check CRP, as elevated platelet count could be reactionary to an inflammatory process.  I will also give her a trial of ferrous sulfate twice daily for 4-6 weeks.  She was told to take stool softener along with iron pills.  I will see her back in 6 weeks and repeat her CBC.  If at that time, her platelet count is still high, will consider testing for myeloproliferative disorders including Jak 2 V617F and BCR/ABL by quantitative PCR.  2.  Mild normocytic anemia: She had a drop in hemoglobin from 12-10 over the last 1 year.  She also has elevated creatinine of 1.18.  She could be having mild chronic kidney disease contributing to her anemia.  Her ferritin was 120.  We will give her a trial of iron twice daily and recheck her labs in 6 weeks.       Derek Jack, MD 09/29/17 11:57 AM

## 2017-09-29 NOTE — Addendum Note (Signed)
Addended by: Farley Ly on: 09/29/2017 01:56 PM   Modules accepted: Orders

## 2017-09-29 NOTE — Addendum Note (Signed)
Addended by: Derek Jack on: 09/29/2017 12:09 PM   Modules accepted: Orders

## 2017-09-29 NOTE — Assessment & Plan Note (Signed)
1.  Thrombocytosis: She was found to have an elevated platelet count of 623 on a CBC dated 09/07/2017.  Prior to that her platelet count in March 2018 was 365.  Patient also reports having fatigue, sudden onset after eating food at a restaurant on 08/29/2017.  The tiredness has slightly improved.  She had an episode of vomiting after eating on that day.  She does not have any B symptoms.  She does not have any palpable adenopathy or splenomegaly.  She has related to iron deficiency state with a low percent saturation of 6 on her iron panel done recently.  She had a mildly elevated creatinine of 1.18.  I plan to repeat a CBC with differential and review of smear.  We will also check CRP, as elevated platelet count could be reactionary to an inflammatory process.  I will also give her a trial of ferrous sulfate twice daily for 4-6 weeks.  She was told to take stool softener along with iron pills.  I will see her back in 6 weeks and repeat her CBC.  If at that time, her platelet count is still high, will consider testing for myeloproliferative disorders including Jak 2 V617F and BCR/ABL by quantitative PCR.  2.  Mild normocytic anemia: She had a drop in hemoglobin from 12-10 over the last 1 year.  She also has elevated creatinine of 1.18.  She could be having mild chronic kidney disease contributing to her anemia.  Her ferritin was 120.  We will give her a trial of iron twice daily and recheck her labs in 6 weeks.

## 2017-10-06 ENCOUNTER — Telehealth: Payer: Self-pay | Admitting: Nurse Practitioner

## 2017-10-06 DIAGNOSIS — E7800 Pure hypercholesterolemia, unspecified: Secondary | ICD-10-CM

## 2017-10-06 DIAGNOSIS — K219 Gastro-esophageal reflux disease without esophagitis: Secondary | ICD-10-CM

## 2017-10-06 DIAGNOSIS — E039 Hypothyroidism, unspecified: Secondary | ICD-10-CM

## 2017-10-06 DIAGNOSIS — M1990 Unspecified osteoarthritis, unspecified site: Secondary | ICD-10-CM

## 2017-10-06 DIAGNOSIS — I10 Essential (primary) hypertension: Secondary | ICD-10-CM

## 2017-10-08 MED ORDER — ATORVASTATIN CALCIUM 40 MG PO TABS: ORAL_TABLET | ORAL | 0 refills | Status: DC

## 2017-10-08 MED ORDER — OMEPRAZOLE 40 MG PO CPDR: DELAYED_RELEASE_CAPSULE | ORAL | 0 refills | Status: DC

## 2017-10-08 MED ORDER — AMLODIPINE BESYLATE 5 MG PO TABS: 5.0000 mg | ORAL_TABLET | Freq: Every day | ORAL | 0 refills | Status: DC

## 2017-10-08 MED ORDER — LISINOPRIL-HYDROCHLOROTHIAZIDE 20-12.5 MG PO TABS: ORAL_TABLET | ORAL | 0 refills | Status: DC

## 2017-10-08 MED ORDER — LEVOTHYROXINE SODIUM 100 MCG PO TABS: 100.0000 ug | ORAL_TABLET | Freq: Every day | ORAL | 0 refills | Status: DC

## 2017-10-08 MED ORDER — MELOXICAM 15 MG PO TABS: 15.0000 mg | ORAL_TABLET | Freq: Every day | ORAL | 0 refills | Status: DC

## 2017-10-23 DIAGNOSIS — R69 Illness, unspecified: Secondary | ICD-10-CM | POA: Diagnosis not present

## 2017-10-23 DIAGNOSIS — I10 Essential (primary) hypertension: Secondary | ICD-10-CM | POA: Diagnosis not present

## 2017-10-23 DIAGNOSIS — E039 Hypothyroidism, unspecified: Secondary | ICD-10-CM | POA: Diagnosis not present

## 2017-10-23 DIAGNOSIS — E785 Hyperlipidemia, unspecified: Secondary | ICD-10-CM | POA: Diagnosis not present

## 2017-10-23 DIAGNOSIS — K08409 Partial loss of teeth, unspecified cause, unspecified class: Secondary | ICD-10-CM | POA: Diagnosis not present

## 2017-10-23 DIAGNOSIS — G4700 Insomnia, unspecified: Secondary | ICD-10-CM | POA: Diagnosis not present

## 2017-10-23 DIAGNOSIS — D649 Anemia, unspecified: Secondary | ICD-10-CM | POA: Diagnosis not present

## 2017-10-23 DIAGNOSIS — G629 Polyneuropathy, unspecified: Secondary | ICD-10-CM | POA: Diagnosis not present

## 2017-10-23 DIAGNOSIS — J309 Allergic rhinitis, unspecified: Secondary | ICD-10-CM | POA: Diagnosis not present

## 2017-10-23 DIAGNOSIS — G8929 Other chronic pain: Secondary | ICD-10-CM | POA: Diagnosis not present

## 2017-11-10 NOTE — Progress Notes (Signed)
[facility] FOLLOW-UP NOTE  Patient Care Team: Chevis Pretty, FNP as PCP - General (Nurse Practitioner)  CHIEF COMPLAINTS:  Thrombocytosis   HISTORY OF PRESENTING ILLNESS:  Gabriela Gardner 67 y.o. female returns for routine follow-up for thrombocytosis.   At her last visit, she was started on oral iron BID. She is tolerating iron supplements well; denies any constipation. She has not required any stool softeners.    Recent labs reviewed with her in detail. It appears that her elevated platelet count was secondary to iron deficiency anemia. Her hemoglobin and platelet counts have improved. Her iron studies for today are pending.      MEDICAL HISTORY:  Past Medical History:  Diagnosis Date  . Hyperlipidemia   . Hypertension   . Skin cancer (melanoma) ([place])    left upper arm  . Thyroid disease     SURGICAL HISTORY: Past Surgical History:  Procedure Laterality Date  . ABDOMINAL HYSTERECTOMY    . SKIN LESION EXCISION      SOCIAL HISTORY: Social History   Socioeconomic History  . Marital status: Married    Spouse name: Not on file  . Number of children: Not on file  . Years of education: Not on file  . Highest education level: Not on file  Occupational History  . Not on file  Social Needs  . Financial resource strain: Not on file  . Food insecurity:    Worry: Not on file    Inability: Not on file  . Transportation needs:    Medical: Not on file    Non-medical: Not on file  Tobacco Use  . Smoking status: Former Smoker    Packs/day: 1.00    Years: 30.00    Pack years: 30.00    Types: Cigarettes    Last attempt to quit: 06/30/2006    Years since quitting: 11.3  . Smokeless tobacco: Never Used  Substance and Sexual Activity  . Alcohol use: Yes    Comment: occasional  . Drug use: No  . Sexual activity: Yes    Birth control/protection: Post-menopausal  Lifestyle  . Physical activity:    Days per week: Not on file    Minutes per session:  Not on file  . Stress: Not on file  Relationships  . Social connections:    Talks on phone: Not on file    Gets together: Not on file    Attends religious service: Not on file    Active member of club or organization: Not on file    Attends meetings of clubs or organizations: Not on file    Relationship status: Not on file  . Intimate partner violence:    Fear of current or ex partner: Not on file    Emotionally abused: Not on file    Physically abused: Not on file    Forced sexual activity: Not on file  Other Topics Concern  . Not on file  Social History Narrative  . Not on file    FAMILY HISTORY: Family History  Adopted: Yes  Problem Relation Age of Onset  . Asthma Son   . Turner syndrome Daughter     ALLERGIES:  is allergic to codeine.  MEDICATIONS:  Current Outpatient Medications  Medication Sig Dispense Refill  . amLODipine (NORVASC) 5 MG tablet Take 1 tablet (5 mg total) by mouth daily. 90 tablet 0  . atorvastatin (LIPITOR) 40 MG tablet TAKE 1 TABLET EVERY DAY (NEED TO BE SEEN) 90 tablet 0  . calcium  carbonate (OS-CAL) 600 MG TABS Take 600 mg by mouth daily.    . ferrous sulfate 325 (65 FE) MG EC tablet Take 325 mg by mouth 2 (two) times daily before a meal.    . levothyroxine (SYNTHROID, LEVOTHROID) 100 MCG tablet Take 1 tablet (100 mcg total) by mouth daily. 90 tablet 0  . lisinopril-hydrochlorothiazide (PRINZIDE,ZESTORETIC) 20-12.5 MG tablet TAKE 2 TABLETS EVERY DAY (NEED MD APPOINTMENT) 180 tablet 0  . meloxicam (MOBIC) 15 MG tablet Take 1 tablet (15 mg total) by mouth daily. 90 tablet 0  . Multiple Vitamin (MULTIVITAMIN) tablet Take 1 tablet by mouth daily.    . naproxen sodium (ALEVE) 220 MG tablet Take 220 mg by mouth.    Marland Kitchen omeprazole (PRILOSEC) 40 MG capsule TAKE 1 CAPSULE EVERY DAY  (NEED MD APPOINTMENT FOR REFILLS) 90 capsule 0   No current facility-administered medications for this visit.     REVIEW OF SYSTEMS:   Constitutional: Denies fevers, chills  or abnormal night sweats.  She has new onset fatigue from 08/30/2017. Eyes: Denies blurriness of vision, double vision or watery eyes she does have occasional headache. Ears, nose, mouth, throat, and face: Denies mucositis or sore throat Respiratory: Denies cough, dyspnea or wheezes Cardiovascular: Denies palpitation, chest discomfort or lower extremity swelling Gastrointestinal:  Denies nausea, heartburn or change in bowel habits Skin: Denies abnormal skin rashes Lymphatics: Denies new lymphadenopathy or easy bruising Neurological:Denies numbness, tingling or new weaknesses Behavioral/Psych: Mood is stable, no new changes  All other systems were reviewed with the patient and are negative.  PHYSICAL EXAMINATION: ECOG PERFORMANCE STATUS: 1 - Symptomatic but completely ambulatory   GENERAL:alert, no distress and comfortable SKIN: skin color, texture, turgor are normal, no rashes or significant lesions   LABORATORY DATA:  I have reviewed the data as listed Recent Results (from the past 2160 hour(s))  Anemia Profile B     Status: Abnormal   Collection Time: 09/07/17  4:01 PM  Result Value Ref Range   Total Iron Binding Capacity 307 250 - 450 ug/dL   UIBC 289 118 - 369 ug/dL   Iron 18 (L) 27 - 139 ug/dL   Iron Saturation 6 (LL) 15 - 55 %   Ferritin 120 15 - 150 ng/mL   Vitamin B-12 1,162 232 - 1,245 pg/mL   Folate >20.0 >3.0 ng/mL    Comment: A serum folate concentration of less than 3.1 ng/mL is considered to represent clinical deficiency.    WBC 10.8 3.4 - 10.8 x10E3/uL   RBC 3.96 3.77 - 5.28 x10E6/uL   Hemoglobin 10.3 (L) 11.1 - 15.9 g/dL   Hematocrit 32.6 (L) 34.0 - 46.6 %   MCV 82 79 - 97 fL   MCH 26.0 (L) 26.6 - 33.0 pg   MCHC 31.6 31.5 - 35.7 g/dL   RDW 15.4 12.3 - 15.4 %   Platelets 623 (H) 150 - 379 x10E3/uL   Neutrophils 83 Not Estab. %   Lymphs 10 Not Estab. %   Monocytes 6 Not Estab. %   Eos 1 Not Estab. %   Basos 0 Not Estab. %   Neutrophils Absolute 9.0 (H)  1.4 - 7.0 x10E3/uL   Lymphocytes Absolute 1.1 0.7 - 3.1 x10E3/uL   Monocytes Absolute 0.6 0.1 - 0.9 x10E3/uL   EOS (ABSOLUTE) 0.1 0.0 - 0.4 x10E3/uL   Basophils Absolute 0.0 0.0 - 0.2 x10E3/uL   Immature Granulocytes 0 Not Estab. %   Immature Grans (Abs) 0.0 0.0 - 0.1 x10E3/uL   Retic  Ct Pct 1.1 0.6 - 2.6 %  CMP14+EGFR     Status: Abnormal   Collection Time: 09/07/17  4:01 PM  Result Value Ref Range   Glucose 91 65 - 99 mg/dL   BUN 19 8 - 27 mg/dL   Creatinine, Ser 1.18 (H) 0.57 - 1.00 mg/dL   GFR calc non Af Amer 48 (L) >59 mL/min/1.73   GFR calc Af Amer 55 (L) >59 mL/min/1.73   BUN/Creatinine Ratio 16 12 - 28   Sodium 138 134 - 144 mmol/L   Potassium 4.2 3.5 - 5.2 mmol/L   Chloride 93 (L) 96 - 106 mmol/L   CO2 23 20 - 29 mmol/L   Calcium 9.3 8.7 - 10.3 mg/dL   Total Protein 7.6 6.0 - 8.5 g/dL   Albumin 4.1 3.6 - 4.8 g/dL   Globulin, Total 3.5 1.5 - 4.5 g/dL   Albumin/Globulin Ratio 1.2 1.2 - 2.2   Bilirubin Total 0.3 0.0 - 1.2 mg/dL   Alkaline Phosphatase 149 (H) 39 - 117 IU/L   AST 53 (H) 0 - 40 IU/L   ALT 73 (H) 0 - 32 IU/L  Thyroid Panel With TSH     Status: Abnormal   Collection Time: 09/07/17  4:01 PM  Result Value Ref Range   TSH 4.400 0.450 - 4.500 uIU/mL   T4, Total 9.0 4.5 - 12.0 ug/dL   T3 Uptake Ratio 21 (L) 24 - 39 %   Free Thyroxine Index 1.9 1.2 - 4.9  H Pylori, IGM, IGG, IGA AB     Status: None   Collection Time: 09/07/17  4:01 PM  Result Value Ref Range   H. pylori, IgG AbS <0.80 0.00 - 0.79 Index Value    Comment:                              Negative           <0.80                              Equivocal    0.80 - 0.89                              Positive           >0.89    H. pylori, IgA Abs <9.0 0.0 - 8.9 units    Comment:                                 Negative          <9.0                                 Equivocal   9.0 - 11.0                                 Positive         >11.0    H pylori, IgM Abs <9.0 0.0 - 8.9 units    Comment:  Negative          <9.0                                 Equivocal   9.0 - 11.0                                 Positive         >11.0 This test was developed and its performance characteristics determined by [facility]. It has not been cleared or approved by the Food and Drug Administration.   CBC with Differential/Platelet     Status: Abnormal   Collection Time: 09/29/17 12:13 PM  Result Value Ref Range   WBC 8.5 4.0 - 10.5 K/uL   RBC 4.20 3.87 - 5.11 MIL/uL   Hemoglobin 10.8 (L) 12.0 - 15.0 g/dL   HCT 35.4 (L) 36.0 - 46.0 %   MCV 84.3 78.0 - 100.0 fL   MCH 25.7 (L) 26.0 - 34.0 pg   MCHC 30.5 30.0 - 36.0 g/dL   RDW 15.9 (H) 11.5 - 15.5 %   Platelets 325 150 - 400 K/uL   Neutrophils Relative % 76 %   Neutro Abs 6.4 1.7 - 7.7 K/uL   Lymphocytes Relative 13 %   Lymphs Abs 1.1 0.7 - 4.0 K/uL   Monocytes Relative 8 %   Monocytes Absolute 0.6 0.1 - 1.0 K/uL   Eosinophils Relative 3 %   Eosinophils Absolute 0.3 0.0 - 0.7 K/uL   Basophils Relative 0 %   Basophils Absolute 0.0 0.0 - 0.1 K/uL    Comment: Performed at [facility], [address], [place], [place] [address]  C-reactive protein     Status: None   Collection Time: 09/29/17 12:13 PM  Result Value Ref Range   CRP <0.8 <1.0 mg/dL    Comment: Performed at [facility] [address] [place], [place] [address]  CBC with Differential     Status: Abnormal   Collection Time: 11/11/17  1:18 PM  Result Value Ref Range   WBC 6.6 4.0 - 10.5 K/uL   RBC 4.42 3.87 - 5.11 MIL/uL   Hemoglobin 12.3 12.0 - 15.0 g/dL   HCT 38.7 36.0 - 46.0 %   MCV 87.6 78.0 - 100.0 fL   MCH 27.8 26.0 - 34.0 pg   MCHC 31.8 30.0 - 36.0 g/dL   RDW 16.8 (H) 11.5 - 15.5 %   Platelets 273 150 - 400 K/uL   Neutrophils Relative % 64 %   Neutro Abs 4.3 1.7 - 7.7 K/uL   Lymphocytes Relative 21 %   Lymphs Abs 1.4 0.7 - 4.0 K/uL   Monocytes Relative 8 %   Monocytes Absolute 0.5 0.1 - 1.0 K/uL   Eosinophils Relative 6 %    Eosinophils Absolute 0.4 0.0 - 0.7 K/uL   Basophils Relative 1 %   Basophils Absolute 0.0 0.0 - 0.1 K/uL    Comment: Performed at [facility], [address] [place],  [address]  Basic metabolic panel     Status: None   Collection Time: 11/11/17  1:18 PM  Result Value Ref Range   Sodium 140 135 - 145 mmol/L   Potassium 3.7 3.5 - 5.1 mmol/L   Chloride 103 101 - 111 mmol/L   CO2 29 22 - 32 mmol/L   Glucose, Bld 98 65 - 99  mg/dL   BUN 17 6 - 20 mg/dL   Creatinine, Ser 0.87 0.44 - 1.00 mg/dL   Calcium 9.3 8.9 - 10.3 mg/dL   GFR calc non Af Amer >60 >60 mL/min   GFR calc Af Amer >60 >60 mL/min    Comment: (NOTE) The eGFR has been calculated using the CKD EPI equation. This calculation has not been validated in all clinical situations. eGFR's persistently <60 mL/min signify possible Chronic Kidney Disease.    Anion gap 8 5 - 15    Comment: Performed at [facility], [address], [place],  [address]    RADIOGRAPHIC STUDIES: Her last mammogram in August 2016 was within normal limits.  ASSESSMENT & PLAN:  Thrombocytosis ([place]) 1.  Thrombocytosis: She was found to have an elevated platelet count of 623 on a CBC dated 09/07/2017.  Prior to that her platelet count in March 2018 was 365.  She was started on iron tablet twice daily on 09/29/2017.  She felt better after taking iron pills.  Today her CBC shows hemoglobin has improved to 12.8.  Platelet count has also normalized.  Ferritin and iron panel is pending.  She did not experience any side effects from iron pills.  She was told to continue 1 tablet twice daily.  We will see her back and check her CBC in 6 months.  If her platelet count is normal at that time, she will be discharged from our clinic.  2.  Mild normocytic anemia: Her hemoglobin improved to normal today after she started taking iron pills 6 weeks ago.  Creatinine has also improved to normal at 0.87.   This note includes documentation from Mike Craze, NP,  who was present during this patient's office visit and evaluation.  I have reviewed this note for its completeness and accuracy.  I have edited this note accordingly based on my findings and medical opinion.      Derek Jack, MD 11/11/17 2:45 PM

## 2017-11-11 ENCOUNTER — Encounter (HOSPITAL_COMMUNITY): Payer: Self-pay | Admitting: Hematology

## 2017-11-11 ENCOUNTER — Inpatient Hospital Stay (HOSPITAL_COMMUNITY): Payer: Medicare HMO | Attending: Hematology

## 2017-11-11 ENCOUNTER — Inpatient Hospital Stay (HOSPITAL_COMMUNITY): Payer: Medicare HMO | Admitting: Hematology

## 2017-11-11 ENCOUNTER — Other Ambulatory Visit: Payer: Self-pay

## 2017-11-11 DIAGNOSIS — Z87891 Personal history of nicotine dependence: Secondary | ICD-10-CM

## 2017-11-11 DIAGNOSIS — I10 Essential (primary) hypertension: Secondary | ICD-10-CM

## 2017-11-11 DIAGNOSIS — D509 Iron deficiency anemia, unspecified: Secondary | ICD-10-CM

## 2017-11-11 DIAGNOSIS — D473 Essential (hemorrhagic) thrombocythemia: Secondary | ICD-10-CM | POA: Diagnosis not present

## 2017-11-11 DIAGNOSIS — D75839 Thrombocytosis, unspecified: Secondary | ICD-10-CM

## 2017-11-11 LAB — BASIC METABOLIC PANEL
ANION GAP: 8 (ref 5–15)
BUN: 17 mg/dL (ref 6–20)
CALCIUM: 9.3 mg/dL (ref 8.9–10.3)
CHLORIDE: 103 mmol/L (ref 101–111)
CO2: 29 mmol/L (ref 22–32)
Creatinine, Ser: 0.87 mg/dL (ref 0.44–1.00)
GFR calc non Af Amer: >60 mL/min (ref >60)
Glucose, Bld: 98 mg/dL (ref 65–99)
Potassium: 3.7 mmol/L (ref 3.5–5.1)
SODIUM: 140 mmol/L (ref 135–145)

## 2017-11-11 LAB — CBC WITH DIFFERENTIAL/PLATELET
Basophils Absolute: 0 10*3/uL (ref 0.0–0.1)
Basophils Relative: 1 %
EOS PCT: 6 %
Eosinophils Absolute: 0.4 10*3/uL (ref 0.0–0.7)
HEMATOCRIT: 38.7 % (ref 36.0–46.0)
Hemoglobin: 12.3 g/dL (ref 12.0–15.0)
LYMPHS ABS: 1.4 10*3/uL (ref 0.7–4.0)
LYMPHS PCT: 21 %
MCH: 27.8 pg (ref 26.0–34.0)
MCHC: 31.8 g/dL (ref 30.0–36.0)
MCV: 87.6 fL (ref 78.0–100.0)
MONOS PCT: 8 %
Monocytes Absolute: 0.5 10*3/uL (ref 0.1–1.0)
NEUTROS ABS: 4.3 10*3/uL (ref 1.7–7.7)
Neutrophils Relative %: 64 %
Platelets: 273 10*3/uL (ref 150–400)
RBC: 4.42 MIL/uL (ref 3.87–5.11)
RDW: 16.8 % — AB (ref 11.5–15.5)
WBC: 6.6 10*3/uL (ref 4.0–10.5)

## 2017-11-11 LAB — IRON AND TIBC
Iron: 89 ug/dL (ref 28–170)
Saturation Ratios: 22 % (ref 10.4–31.8)
TIBC: 407 ug/dL (ref 250–450)
UIBC: 318 ug/dL

## 2017-11-11 LAB — FERRITIN: Ferritin: 22 ng/mL (ref 11–307)

## 2017-11-11 NOTE — Assessment & Plan Note (Signed)
1.  Thrombocytosis: She was found to have an elevated platelet count of 623 on a CBC dated 09/07/2017.  Prior to that her platelet count in March 2018 was 365.  She was started on iron tablet twice daily on 09/29/2017.  She felt better after taking iron pills.  Today her CBC shows hemoglobin has improved to 12.8.  Platelet count has also normalized.  Ferritin and iron panel is pending.  She did not experience any side effects from iron pills.  She was told to continue 1 tablet twice daily.  We will see her back and check her CBC in 6 months.  If her platelet count is normal at that time, she will be discharged from our clinic.  2.  Mild normocytic anemia: Her hemoglobin improved to normal today after she started taking iron pills 6 weeks ago.  Creatinine has also improved to normal at 0.87.

## 2017-11-11 NOTE — Patient Instructions (Signed)
[place] at [facility] Discharge Instructions  Seen Dr. Delton Coombes today Follow up as scheduled.   Thank you for choosing [place] at [facility] to provide your oncology and hematology care.  To afford each patient quality time with our provider, please arrive at least 15 minutes before your scheduled appointment time.   If you have a lab appointment with the [place] [facility] please come in thru the  Main Entrance and check in at the main information desk  You need to re-schedule your appointment should you arrive 10 or more minutes late.  We strive to give you quality time with our providers, and arriving late affects you and other patients whose appointments are after yours.  Also, if you no show three or more times for appointments you may be dismissed from the clinic at the providers discretion.     Again, thank you for choosing [facility]  Our hope is that these requests will decrease the amount of time that you wait before being seen by our physicians.       _____________________________________________________________  Should you have questions after your visit to [facility], please contact our office at [phone] between the hours of 8:30 a.m. and 4:30 p.m.  Voicemails left after 4:30 p.m. will not be returned until the following business day.  For prescription refill requests, have your pharmacy contact our office.       Resources For Cancer Patients and their Caregivers ? American Cancer Society: Can assist with transportation, wigs, general needs, runs Look Good Feel Better.        303-574-5091 ? Cancer Care: Provides financial assistance, online support groups, medication/co-pay assistance.  1-800-813-HOPE 315 466 1233) ? [place] Assists [place] cancer patients and their families through emotional , educational and financial support.  (510) 008-4999 ? [place]  DSS Where to apply for food stamps, Medicaid and utility assistance. 878 565 0598 ? [facility]: Transportation to medical appointments. 678-185-6328 ? Social Security Administration: May apply for disability if have a Stage IV cancer. 445-881-1702 210-600-0326 ? [employer], Disability and Transit Services: Assists with nutrition, care and transit needs. [place] Support Programs:   > Cancer Support Group  2nd Tuesday of the month 1pm-2pm, Journey Room   > Creative Journey  3rd Tuesday of the month 1130am-1pm, Journey Room

## 2017-11-24 DIAGNOSIS — Z1231 Encounter for screening mammogram for malignant neoplasm of breast: Secondary | ICD-10-CM | POA: Diagnosis not present

## 2017-11-24 LAB — HM MAMMOGRAPHY

## 2018-01-01 DIAGNOSIS — D224 Melanocytic nevi of scalp and neck: Secondary | ICD-10-CM | POA: Diagnosis not present

## 2018-01-01 DIAGNOSIS — L400 Psoriasis vulgaris: Secondary | ICD-10-CM | POA: Diagnosis not present

## 2018-01-01 DIAGNOSIS — L738 Other specified follicular disorders: Secondary | ICD-10-CM | POA: Diagnosis not present

## 2018-01-01 DIAGNOSIS — Z85820 Personal history of malignant melanoma of skin: Secondary | ICD-10-CM | POA: Diagnosis not present

## 2018-01-01 DIAGNOSIS — D225 Melanocytic nevi of trunk: Secondary | ICD-10-CM | POA: Diagnosis not present

## 2018-01-01 DIAGNOSIS — L821 Other seborrheic keratosis: Secondary | ICD-10-CM | POA: Diagnosis not present

## 2018-01-01 DIAGNOSIS — L570 Actinic keratosis: Secondary | ICD-10-CM | POA: Diagnosis not present

## 2018-01-01 DIAGNOSIS — D2272 Melanocytic nevi of left lower limb, including hip: Secondary | ICD-10-CM | POA: Diagnosis not present

## 2018-01-14 ENCOUNTER — Other Ambulatory Visit: Payer: Self-pay | Admitting: Nurse Practitioner

## 2018-01-14 DIAGNOSIS — E7800 Pure hypercholesterolemia, unspecified: Secondary | ICD-10-CM

## 2018-01-14 DIAGNOSIS — E039 Hypothyroidism, unspecified: Secondary | ICD-10-CM

## 2018-01-14 DIAGNOSIS — I10 Essential (primary) hypertension: Secondary | ICD-10-CM

## 2018-01-14 DIAGNOSIS — K219 Gastro-esophageal reflux disease without esophagitis: Secondary | ICD-10-CM

## 2018-01-14 NOTE — Telephone Encounter (Signed)
What is the name of the medication? All medications called into [employer] [facility] order services. Has a week left  Have you contacted your pharmacy to request a refill? NO   Which pharmacy would you like this sent to? [facility] mail order   Patient notified that their request is being sent to the clinical staff for review and that they should receive a call once it is complete. If they do not receive a call within 24 hours they can check with their pharmacy or our office.

## 2018-01-15 MED ORDER — ATORVASTATIN CALCIUM 40 MG PO TABS: ORAL_TABLET | ORAL | 0 refills | Status: DC

## 2018-01-15 MED ORDER — AMLODIPINE BESYLATE 5 MG PO TABS: 5.0000 mg | ORAL_TABLET | Freq: Every day | ORAL | 0 refills | Status: DC

## 2018-01-15 MED ORDER — LEVOTHYROXINE SODIUM 100 MCG PO TABS: 100.0000 ug | ORAL_TABLET | Freq: Every day | ORAL | 0 refills | Status: DC

## 2018-01-15 MED ORDER — LISINOPRIL-HYDROCHLOROTHIAZIDE 20-12.5 MG PO TABS: ORAL_TABLET | ORAL | 0 refills | Status: DC

## 2018-01-15 MED ORDER — OMEPRAZOLE 40 MG PO CPDR: DELAYED_RELEASE_CAPSULE | ORAL | 0 refills | Status: DC

## 2018-01-15 NOTE — Telephone Encounter (Signed)
appt made for 7/26 Refill sent to pharmacy

## 2018-01-22 ENCOUNTER — Encounter: Payer: Self-pay | Admitting: Nurse Practitioner

## 2018-01-22 ENCOUNTER — Ambulatory Visit (INDEPENDENT_AMBULATORY_CARE_PROVIDER_SITE_OTHER): Payer: Medicare HMO | Admitting: Nurse Practitioner

## 2018-01-22 VITALS — BP 135/75 | HR 55 | Temp 97.0°F | Ht 64.0 in | Wt 170.0 lb

## 2018-01-22 DIAGNOSIS — D473 Essential (hemorrhagic) thrombocythemia: Secondary | ICD-10-CM

## 2018-01-22 DIAGNOSIS — I10 Essential (primary) hypertension: Secondary | ICD-10-CM | POA: Diagnosis not present

## 2018-01-22 DIAGNOSIS — R69 Illness, unspecified: Secondary | ICD-10-CM | POA: Diagnosis not present

## 2018-01-22 DIAGNOSIS — F5101 Primary insomnia: Secondary | ICD-10-CM

## 2018-01-22 DIAGNOSIS — K219 Gastro-esophageal reflux disease without esophagitis: Secondary | ICD-10-CM | POA: Diagnosis not present

## 2018-01-22 DIAGNOSIS — E7800 Pure hypercholesterolemia, unspecified: Secondary | ICD-10-CM

## 2018-01-22 DIAGNOSIS — E039 Hypothyroidism, unspecified: Secondary | ICD-10-CM | POA: Diagnosis not present

## 2018-01-22 DIAGNOSIS — D75839 Thrombocytosis, unspecified: Secondary | ICD-10-CM

## 2018-01-22 DIAGNOSIS — Z6828 Body mass index (BMI) 28.0-28.9, adult: Secondary | ICD-10-CM

## 2018-01-22 MED ORDER — ATORVASTATIN CALCIUM 40 MG PO TABS: ORAL_TABLET | ORAL | 1 refills | Status: DC

## 2018-01-22 MED ORDER — OMEPRAZOLE 40 MG PO CPDR: DELAYED_RELEASE_CAPSULE | ORAL | 0 refills | Status: DC

## 2018-01-22 MED ORDER — AMLODIPINE BESYLATE 5 MG PO TABS: 5.0000 mg | ORAL_TABLET | Freq: Every day | ORAL | 1 refills | Status: DC

## 2018-01-22 MED ORDER — LEVOTHYROXINE SODIUM 100 MCG PO TABS: 100.0000 ug | ORAL_TABLET | Freq: Every day | ORAL | 1 refills | Status: DC

## 2018-01-22 MED ORDER — LISINOPRIL-HYDROCHLOROTHIAZIDE 20-12.5 MG PO TABS: ORAL_TABLET | ORAL | 1 refills | Status: DC

## 2018-01-22 MED ORDER — FERROUS SULFATE 325 (65 FE) MG PO TBEC: 325.0000 mg | DELAYED_RELEASE_TABLET | Freq: Two times a day (BID) | ORAL | 1 refills | Status: DC

## 2018-01-22 NOTE — Patient Instructions (Signed)
Fat and Cholesterol Restricted Diet High levels of fat and cholesterol in your blood may lead to various health problems, such as diseases of the heart, blood vessels, gallbladder, liver, and pancreas. Fats are concentrated sources of energy that come in various forms. Certain types of fat, including saturated fat, may be harmful in excess. Cholesterol is a substance needed by your body in small amounts. Your body makes all the cholesterol it needs. Excess cholesterol comes from the food you eat. When you have high levels of cholesterol and saturated fat in your blood, health problems can develop because the excess fat and cholesterol will gather along the walls of your blood vessels, causing them to narrow. Choosing the right foods will help you control your intake of fat and cholesterol. This will help keep the levels of these substances in your blood within normal limits and reduce your risk of disease. What is my plan? Your health care provider recommends that you:  Limit your fat intake to ______% or less of your total calories per day.  Limit the amount of cholesterol in your diet to less than _________mg per day.  Eat 20-30 grams of fiber each day.  What types of fat should I choose?  Choose healthy fats more often. Choose monounsaturated and polyunsaturated fats, such as olive and canola oil, flaxseeds, walnuts, almonds, and seeds.  Eat more omega-3 fats. Good choices include salmon, mackerel, sardines, tuna, flaxseed oil, and ground flaxseeds. Aim to eat fish at least two times a week.  Limit saturated fats. Saturated fats are primarily found in animal products, such as meats, butter, and cream. Plant sources of saturated fats include palm oil, palm kernel oil, and coconut oil.  Avoid foods with partially hydrogenated oils in them. These contain trans fats. Examples of foods that contain trans fats are stick margarine, some tub margarines, cookies, crackers, and other baked goods. What  general guidelines do I need to follow? These guidelines for healthy eating will help you control your intake of fat and cholesterol:  Check food labels carefully to identify foods with trans fats or high amounts of saturated fat.  Fill one half of your plate with vegetables and green salads.  Fill one fourth of your plate with whole grains. Look for the word "whole" as the first word in the ingredient list.  Fill one fourth of your plate with lean protein foods.  Limit fruit to two servings a day. Choose fruit instead of juice.  Eat more foods that contain fiber, such as apples, broccoli, carrots, beans, peas, and barley.  Eat more home-cooked food and less restaurant, buffet, and fast food.  Limit or avoid alcohol.  Limit foods high in starch and sugar.  Limit fried foods.  Cook foods using methods other than frying. Baking, boiling, grilling, and broiling are all great options.  Lose weight if you are overweight. Losing just 5-10% of your initial body weight can help your overall health and prevent diseases such as diabetes and heart disease.  What foods can I eat? Grains  Whole grains, such as whole wheat or whole grain breads, crackers, cereals, and pasta. Unsweetened oatmeal, bulgur, barley, quinoa, or brown rice. Corn or whole wheat flour tortillas. Vegetables  Fresh or frozen vegetables (raw, steamed, roasted, or grilled). Green salads. Fruits  All fresh, canned (in natural juice), or frozen fruits. Meats and other protein foods  Ground beef (85% or leaner), grass-fed beef, or beef trimmed of fat. Skinless chicken or turkey. Ground chicken or turkey.   Pork trimmed of fat. All fish and seafood. Eggs. Dried beans, peas, or lentils. Unsalted nuts or seeds. Unsalted canned or dry beans. Dairy  Low-fat dairy products, such as skim or 1% milk, 2% or reduced-fat cheeses, low-fat ricotta or cottage cheese, or plain low-fat yo Fats and oils  Tub margarines without trans  fats. Light or reduced-fat mayonnaise and salad dressings. Avocado. Olive, canola, sesame, or safflower oils. Natural peanut or almond butter (choose ones without added sugar and oil). The items listed above may not be a complete list of recommended foods or beverages. Contact your dietitian for more options. Foods to avoid Grains  White bread. White pasta. White rice. Cornbread. Bagels, pastries, and croissants. Crackers that contain trans fat. Vegetables  White potatoes. Corn. Creamed or fried vegetables. Vegetables in a cheese sauce. Fruits  Dried fruits. Canned fruit in light or heavy syrup. Fruit juice. Meats and other protein foods  Fatty cuts of meat. Ribs, chicken wings, bacon, sausage, bologna, salami, chitterlings, fatback, hot dogs, bratwurst, and packaged luncheon meats. Liver and organ meats. Dairy  Whole or 2% milk, cream, half-and-half, and cream cheese. Whole milk cheeses. Whole-fat or sweetened yogurt. Full-fat cheeses. Nondairy creamers and whipped toppings. Processed cheese, cheese spreads, or cheese curds. Beverages  Alcohol. Sweetened drinks (such as sodas, lemonade, and fruit drinks or punches). Fats and oils  Butter, stick margarine, lard, shortening, ghee, or bacon fat. Coconut, palm kernel, or palm oils. Sweets and desserts  Corn syrup, sugars, honey, and molasses. Candy. Jam and jelly. Syrup. Sweetened cereals. Cookies, pies, cakes, donuts, muffins, and ice cream. The items listed above may not be a complete list of foods and beverages to avoid. Contact your dietitian for more information. This information is not intended to replace advice given to you by your health care provider. Make sure you discuss any questions you have with your health care provider. Document Released: 06/16/2005 Document Revised: 07/07/2014 Document Reviewed: 09/14/2013 Elsevier Interactive Patient Education  2018 Elsevier Inc.  

## 2018-01-22 NOTE — Progress Notes (Signed)
Subjective:    Patient ID: Gabriela Gardner, female    DOB: 01-12-1951, 67 y.o.   MRN: [id number]   Chief Complaint: Medical Management of Chronic Issues   HPI:  1. Essential hypertension  no c/o chest pain, sob or headache. Does not check blood pressure at home. BP Readings from Last 3 Encounters:  01/22/18 135/75  11/11/17 (!) 146/65  09/29/17 (!) 155/70     2. Gastroesophageal reflux disease without esophagitis  Takes omeprazole daily. Works well to keep symptoms under control.  3. Hypothyroidism, unspecified type  No problems that she is aware of.  4. Primary insomnia  Takes unisom nightly to sleep- working well. Feels rested jin mornings  5. Thrombocytosis [facility]) sees hematologist every 6 months. Last labs were normal. Was told if normal at next visit, will not need to be seen anymore.  6. BMI 28.0-28.9,adult  No recent weight changes  7. Pure hypercholesterolemia  Tries to avoid fried foods. No exercise    Outpatient Encounter Medications as of 01/22/2018  Medication Sig  . amLODipine (NORVASC) 5 MG tablet Take 1 tablet (5 mg total) by mouth daily.  Marland Kitchen atorvastatin (LIPITOR) 40 MG tablet TAKE 1 TABLET EVERY DAY (NEED TO BE SEEN)  . calcium carbonate (OS-CAL) 600 MG TABS Take 600 mg by mouth daily.  . ferrous sulfate 325 (65 FE) MG EC tablet Take 325 mg by mouth 2 (two) times daily before a meal.  . levothyroxine (SYNTHROID, LEVOTHROID) 100 MCG tablet Take 1 tablet (100 mcg total) by mouth daily.  Marland Kitchen lisinopril-hydrochlorothiazide (PRINZIDE,ZESTORETIC) 20-12.5 MG tablet TAKE 2 TABLETS EVERY DAY  . meloxicam (MOBIC) 15 MG tablet Take 1 tablet (15 mg total) by mouth daily.  . Multiple Vitamin (MULTIVITAMIN) tablet Take 1 tablet by mouth daily.  . naproxen sodium (ALEVE) 220 MG tablet Take 220 mg by mouth.  Marland Kitchen omeprazole (PRILOSEC) 40 MG capsule TAKE 1 CAPSULE EVERY DAY      New complaints: None today  Social history: Lives with husband of 35 years   Review of Systems    Constitutional: Negative for activity change and appetite change.  HENT: Negative.   Eyes: Negative for pain.  Respiratory: Negative for shortness of breath.   Cardiovascular: Negative for chest pain, palpitations and leg swelling.  Gastrointestinal: Negative for abdominal pain.  Endocrine: Negative for polydipsia.  Genitourinary: Negative.   Skin: Negative for rash.  Neurological: Negative for dizziness, weakness and headaches.  Hematological: Does not bruise/bleed easily.  Psychiatric/Behavioral: Negative.   All other systems reviewed and are negative.      Objective:   Physical Exam  Constitutional: She is oriented to person, place, and time. She appears well-developed and well-nourished. No distress.  HENT:  Head: Normocephalic.  Nose: Nose normal.  Mouth/Throat: Oropharynx is clear and moist.  Eyes: Pupils are equal, round, and reactive to light. EOM are normal.  Neck: Normal range of motion. Neck supple. No JVD present. Carotid bruit is not present.  Cardiovascular: Normal rate, regular rhythm, normal heart sounds and intact distal pulses.  Pulmonary/Chest: Effort normal and breath sounds normal. No respiratory distress. She has no wheezes. She has no rales. She exhibits no tenderness.  Abdominal: Soft. Normal appearance, normal aorta and bowel sounds are normal. She exhibits no distension, no abdominal bruit, no pulsatile midline mass and no mass. There is no splenomegaly or hepatomegaly. There is no tenderness.  Musculoskeletal: Normal range of motion. She exhibits no edema.  Lymphadenopathy:    She has no cervical adenopathy.  Neurological: She is alert and oriented to person, place, and time. She has normal reflexes.  Skin: Skin is warm and dry.  Psychiatric: She has a normal mood and affect. Her behavior is normal. Judgment and thought content normal.  Nursing note and vitals reviewed.  BP 135/75   Pulse (!) 55   Temp (!) 97 F (36.1 C) (Oral)   Ht 5' 4"  (1.626 m)    Wt 170 lb (77.1 kg)   BMI 29.18 kg/m         Assessment & Plan:  Gabriela Gardner comes in today with chief complaint of Medical Management of Chronic Issues   Diagnosis and orders addressed:  1. Essential hypertension Low sodium diet - amLODipine (NORVASC) 5 MG tablet; Take 1 tablet (5 mg total) by mouth daily.  Dispense: 90 tablet; Refill: 1 - lisinopril-hydrochlorothiazide (PRINZIDE,ZESTORETIC) 20-12.5 MG tablet; TAKE 2 TABLETS EVERY DAY  Dispense: 180 tablet; Refill: 1 - CMP14+EGFR  2. Gastroesophageal reflux disease without esophagitis Avoid spicy foods Do not eat 2 hours prior to bedtime - omeprazole (PRILOSEC) 40 MG capsule; TAKE 1 CAPSULE EVERY DAY  Dispense: 90 capsule; Refill: 0  3. Acquired hypothyroidism - levothyroxine (SYNTHROID, LEVOTHROID) 100 MCG tablet; Take 1 tablet (100 mcg total) by mouth daily.  Dispense: 90 tablet; Refill: 1 - Thyroid Panel With TSH  4. Primary insomnia bedtime routine Ok to continue unisom as needed  5. Thrombocytosis ([place]) Keep follow up with hematology - ferrous sulfate 325 (65 FE) MG EC tablet; Take 1 tablet (325 mg total) by mouth 2 (two) times daily before a meal.  Dispense: 90 tablet; Refill: 1  6. BMI 28.0-28.9,adult Discussed diet and exercise for person with BMI >25 Will recheck weight in 3-6 months  7. Pure hypercholesterolemia Low fat diet - atorvastatin (LIPITOR) 40 MG tablet; TAKE 1 TABLET EVERY DAY (NEED TO BE SEEN)  Dispense: 90 tablet; Refill: 1 - Lipid panel   Labs pending Health Maintenance reviewed Diet and exercise encouraged  Follow up plan: 6 months   [place], FNP

## 2018-01-23 LAB — CMP14+EGFR
ALBUMIN: 4.6 g/dL (ref 3.6–4.8)
ALT: 44 IU/L — ABNORMAL HIGH (ref 0–32)
AST: 35 IU/L (ref 0–40)
Albumin/Globulin Ratio: 1.6 (ref 1.2–2.2)
Alkaline Phosphatase: 84 IU/L (ref 39–117)
BUN/Creatinine Ratio: 20 (ref 12–28)
BUN: 20 mg/dL (ref 8–27)
Bilirubin Total: 0.4 mg/dL (ref 0.0–1.2)
CO2: 24 mmol/L (ref 20–29)
Calcium: 9.8 mg/dL (ref 8.7–10.3)
Chloride: 102 mmol/L (ref 96–106)
Creatinine, Ser: 0.99 mg/dL (ref 0.57–1.00)
GFR calc Af Amer: 68 mL/min/{1.73_m2} (ref >59)
GFR calc non Af Amer: 59 mL/min/{1.73_m2} — ABNORMAL LOW (ref >59)
GLOBULIN, TOTAL: 2.8 g/dL (ref 1.5–4.5)
Glucose: 112 mg/dL — ABNORMAL HIGH (ref 65–99)
Potassium: 4.5 mmol/L (ref 3.5–5.2)
SODIUM: 143 mmol/L (ref 134–144)
Total Protein: 7.4 g/dL (ref 6.0–8.5)

## 2018-01-23 LAB — THYROID PANEL WITH TSH
Free Thyroxine Index: 2 (ref 1.2–4.9)
T3 UPTAKE RATIO: 25 % (ref 24–39)
T4, Total: 7.9 ug/dL (ref 4.5–12.0)
TSH: 1.11 u[IU]/mL (ref 0.450–4.500)

## 2018-01-23 LAB — LIPID PANEL
Chol/HDL Ratio: 3.6 ratio (ref 0.0–4.4)
Cholesterol, Total: 171 mg/dL (ref 100–199)
HDL: 47 mg/dL (ref >39)
LDL Calculated: 92 mg/dL (ref 0–99)
Triglycerides: 158 mg/dL — ABNORMAL HIGH (ref 0–149)
VLDL Cholesterol Cal: 32 mg/dL (ref 5–40)

## 2018-02-22 ENCOUNTER — Other Ambulatory Visit: Payer: Self-pay

## 2018-02-22 DIAGNOSIS — M1990 Unspecified osteoarthritis, unspecified site: Secondary | ICD-10-CM

## 2018-02-22 MED ORDER — MELOXICAM 15 MG PO TABS: 15.0000 mg | ORAL_TABLET | Freq: Every day | ORAL | 0 refills | Status: DC

## 2018-03-21 DIAGNOSIS — R69 Illness, unspecified: Secondary | ICD-10-CM | POA: Diagnosis not present

## 2018-05-12 ENCOUNTER — Other Ambulatory Visit (HOSPITAL_COMMUNITY): Payer: Medicare HMO

## 2018-05-14 ENCOUNTER — Ambulatory Visit (HOSPITAL_COMMUNITY): Payer: Medicare HMO | Admitting: Hematology

## 2018-05-28 ENCOUNTER — Other Ambulatory Visit: Payer: Self-pay | Admitting: Nurse Practitioner

## 2018-05-28 DIAGNOSIS — M1990 Unspecified osteoarthritis, unspecified site: Secondary | ICD-10-CM

## 2018-05-28 NOTE — Telephone Encounter (Signed)
Last seen 01/22/18, no future appt made. Ok to refill?

## 2018-07-09 ENCOUNTER — Ambulatory Visit (INDEPENDENT_AMBULATORY_CARE_PROVIDER_SITE_OTHER): Payer: Medicare HMO | Admitting: Nurse Practitioner

## 2018-07-09 ENCOUNTER — Encounter: Payer: Self-pay | Admitting: Nurse Practitioner

## 2018-07-09 VITALS — BP 129/78 | HR 65 | Temp 96.8°F

## 2018-07-09 DIAGNOSIS — E039 Hypothyroidism, unspecified: Secondary | ICD-10-CM | POA: Diagnosis not present

## 2018-07-09 DIAGNOSIS — E7800 Pure hypercholesterolemia, unspecified: Secondary | ICD-10-CM

## 2018-07-09 DIAGNOSIS — K219 Gastro-esophageal reflux disease without esophagitis: Secondary | ICD-10-CM

## 2018-07-09 DIAGNOSIS — F5101 Primary insomnia: Secondary | ICD-10-CM

## 2018-07-09 DIAGNOSIS — I10 Essential (primary) hypertension: Secondary | ICD-10-CM | POA: Diagnosis not present

## 2018-07-09 DIAGNOSIS — D473 Essential (hemorrhagic) thrombocythemia: Secondary | ICD-10-CM

## 2018-07-09 DIAGNOSIS — Z6828 Body mass index (BMI) 28.0-28.9, adult: Secondary | ICD-10-CM | POA: Diagnosis not present

## 2018-07-09 DIAGNOSIS — D75839 Thrombocytosis, unspecified: Secondary | ICD-10-CM

## 2018-07-09 DIAGNOSIS — R69 Illness, unspecified: Secondary | ICD-10-CM | POA: Diagnosis not present

## 2018-07-09 MED ORDER — AMLODIPINE BESYLATE 5 MG PO TABS: 5.0000 mg | ORAL_TABLET | Freq: Every day | ORAL | 1 refills | Status: DC

## 2018-07-09 MED ORDER — LISINOPRIL-HYDROCHLOROTHIAZIDE 20-12.5 MG PO TABS: ORAL_TABLET | ORAL | 1 refills | Status: DC

## 2018-07-09 MED ORDER — OMEPRAZOLE 40 MG PO CPDR: DELAYED_RELEASE_CAPSULE | ORAL | 1 refills | Status: DC

## 2018-07-09 MED ORDER — LEVOTHYROXINE SODIUM 100 MCG PO TABS: 100.0000 ug | ORAL_TABLET | Freq: Every day | ORAL | 1 refills | Status: DC

## 2018-07-09 MED ORDER — FERROUS SULFATE 325 (65 FE) MG PO TBEC: 325.0000 mg | DELAYED_RELEASE_TABLET | Freq: Two times a day (BID) | ORAL | 1 refills | Status: DC

## 2018-07-09 MED ORDER — ATORVASTATIN CALCIUM 40 MG PO TABS: ORAL_TABLET | ORAL | 1 refills | Status: DC

## 2018-07-09 NOTE — Patient Instructions (Signed)
Health Maintenance After Age 69 After age 69, you are at a higher risk for certain long-term diseases and infections as well as injuries from falls. Falls are a major cause of broken bones and head injuries in people who are older than age 69. Getting regular preventive care can help to keep you healthy and well. Preventive care includes getting regular testing and making lifestyle changes as recommended by your health care provider. Talk with your health care provider about:  Which screenings and tests you should have. A screening is a test that checks for a disease when you have no symptoms.  A diet and exercise plan that is right for you. What should I know about screenings and tests to prevent falls? Screening and testing are the best ways to find a health problem early. Early diagnosis and treatment give you the best chance of managing medical conditions that are common after age 69. Certain conditions and lifestyle choices may make you more likely to have a fall. Your health care provider may recommend:  Regular vision checks. Poor vision and conditions such as cataracts can make you more likely to have a fall. If you wear glasses, make sure to get your prescription updated if your vision changes.  Medicine review. Work with your health care provider to regularly review all of the medicines you are taking, including over-the-counter medicines. Ask your health care provider about any side effects that may make you more likely to have a fall. Tell your health care provider if any medicines that you take make you feel dizzy or sleepy.  Osteoporosis screening. Osteoporosis is a condition that causes the bones to get weaker. This can make the bones weak and cause them to break more easily.  Blood pressure screening. Blood pressure changes and medicines to control blood pressure can make you feel dizzy.  Strength and balance checks. Your health care provider may recommend certain tests to check your  strength and balance while standing, walking, or changing positions.  Foot health exam. Foot pain and numbness, as well as not wearing proper footwear, can make you more likely to have a fall.  Depression screening. You may be more likely to have a fall if you have a fear of falling, feel emotionally low, or feel unable to do activities that you used to do.  Alcohol use screening. Using too much alcohol can affect your balance and may make you more likely to have a fall. What actions can I take to lower my risk of falls? General instructions  Talk with your health care provider about your risks for falling. Tell your health care provider if: ? You fall. Be sure to tell your health care provider about all falls, even ones that seem minor. ? You feel dizzy, sleepy, or off-balance.  Take over-the-counter and prescription medicines only as told by your health care provider. These include any supplements.  Eat a healthy diet and maintain a healthy weight. A healthy diet includes low-fat dairy products, low-fat (lean) meats, and fiber from whole grains, beans, and lots of fruits and vegetables. Home safety  Remove any tripping hazards, such as rugs, cords, and clutter.  Install safety equipment such as grab bars in bathrooms and safety rails on stairs.  Keep rooms and walkways well-lit. Activity   Follow a regular exercise program to stay fit. This will help you maintain your balance. Ask your health care provider what types of exercise are appropriate for you.  If you need a cane or   walker, use it as recommended by your health care provider.  Wear supportive shoes that have nonskid soles. Lifestyle  Do not drink alcohol if your health care provider tells you not to drink.  If you drink alcohol, limit how much you have: ? 0-1 drink a day for women. ? 0-2 drinks a day for men.  Be aware of how much alcohol is in your drink. In the [place] one drink equals one typical bottle of beer (12  oz), one-half glass of wine (5 oz), or one shot of hard liquor (1 oz).  Do not use any products that contain nicotine or tobacco, such as cigarettes and e-cigarettes. If you need help quitting, ask your health care provider. Summary  Having a healthy lifestyle and getting preventive care can help to protect your health and wellness after age 69.  Screening and testing are the best way to find a health problem early and help you avoid having a fall. Early diagnosis and treatment give you the best chance for managing medical conditions that are more common for people who are older than age 69.  Falls are a major cause of broken bones and head injuries in people who are older than age 69. Take precautions to prevent a fall at home.  Work with your health care provider to learn what changes you can make to improve your health and wellness and to prevent falls. This information is not intended to replace advice given to you by your health care provider. Make sure you discuss any questions you have with your health care provider. Document Released: 04/29/2017 Document Revised: 04/29/2017 Document Reviewed: 04/29/2017 Elsevier Interactive Patient Education  2019 Elsevier Inc.  

## 2018-07-09 NOTE — Progress Notes (Signed)
Subjective:    Patient ID: Gabriela Gardner, female    DOB: [birth year], 68 y.o.   MRN: [id number]   Chief Complaint: medical management of chronic issues  HPI:  1. Essential hypertension  No c/o chest  Pain sob or headache. Does not check blood pressure at home. BP Readings from Last 3 Encounters:  07/09/18 129/78  01/22/18 135/75  11/11/17 (!) 146/65     2. Gastroesophageal reflux disease without esophagitis  Takes a daily omeprazole, which works well to keep symptoms under control  3. Hypothyroidism, unspecified type  No problems that she is aware of.  4. Thrombocytosis ([place])  Last cbc was normal  5. Primary insomnia  She is currently doing unisom OTC which works well for her. Feels rested in mornings.  6. Pure hypercholesterolemia  Tries to avoid fried foods, does not do much exercise.  7. BMI 28.0-28.9,adult  No recent weight changes    Outpatient Encounter Medications as of 07/09/2018  Medication Sig  . amLODipine (NORVASC) 5 MG tablet Take 1 tablet (5 mg total) by mouth daily.  Marland Kitchen atorvastatin (LIPITOR) 40 MG tablet TAKE 1 TABLET EVERY DAY (NEED TO BE SEEN)  . calcium carbonate (OS-CAL) 600 MG TABS Take 600 mg by mouth daily.  . ferrous sulfate 325 (65 FE) MG EC tablet Take 1 tablet (325 mg total) by mouth 2 (two) times daily before a meal.  . levothyroxine (SYNTHROID, LEVOTHROID) 100 MCG tablet Take 1 tablet (100 mcg total) by mouth daily.  Marland Kitchen lisinopril-hydrochlorothiazide (PRINZIDE,ZESTORETIC) 20-12.5 MG tablet TAKE 2 TABLETS EVERY DAY  . meloxicam (MOBIC) 15 MG tablet TAKE 1 TABLET DAILY  . Multiple Vitamin (MULTIVITAMIN) tablet Take 1 tablet by mouth daily.  . naproxen sodium (ALEVE) 220 MG tablet Take 220 mg by mouth.  Marland Kitchen omeprazole (PRILOSEC) 40 MG capsule TAKE 1 CAPSULE EVERY DAY      New complaints: None today  Social history: Just had a new great grandson that live in [place].   Review of Systems  Constitutional: Negative for activity change and appetite  change.  HENT: Negative.   Eyes: Negative for pain.  Respiratory: Negative for shortness of breath.   Cardiovascular: Negative for chest pain, palpitations and leg swelling.  Gastrointestinal: Negative for abdominal pain.  Endocrine: Negative for polydipsia.  Genitourinary: Negative.   Skin: Negative for rash.  Neurological: Negative for dizziness, weakness and headaches.  Hematological: Does not bruise/bleed easily.  Psychiatric/Behavioral: Negative.   All other systems reviewed and are negative.      Objective:   Physical Exam Vitals signs and nursing note reviewed.  Constitutional:      General: She is not in acute distress.    Appearance: Normal appearance. She is well-developed.  HENT:     Head: Normocephalic.     Nose: Nose normal.  Eyes:     Pupils: Pupils are equal, round, and reactive to light.  Neck:     Musculoskeletal: Normal range of motion and neck supple.     Vascular: No carotid bruit or JVD.  Cardiovascular:     Rate and Rhythm: Normal rate and regular rhythm.     Heart sounds: Normal heart sounds.  Pulmonary:     Effort: Pulmonary effort is normal. No respiratory distress.     Breath sounds: Normal breath sounds. No wheezing or rales.  Chest:     Chest wall: No tenderness.  Abdominal:     General: Bowel sounds are normal. There is no distension or abdominal bruit.  Palpations: Abdomen is soft. There is no hepatomegaly, splenomegaly, mass or pulsatile mass.     Tenderness: There is no abdominal tenderness.  Musculoskeletal: Normal range of motion.  Lymphadenopathy:     Cervical: No cervical adenopathy.  Skin:    General: Skin is warm and dry.  Neurological:     Mental Status: She is alert and oriented to person, place, and time.     Deep Tendon Reflexes: Reflexes are normal and symmetric.  Psychiatric:        Behavior: Behavior normal.        Thought Content: Thought content normal.        Judgment: Judgment normal.     BP 129/78   Pulse  65   Temp (!) 96.8 F (36 C) (Oral)       Assessment & Plan:  Kynzi Levay comes in today with chief complaint of No chief complaint on file.   Diagnosis and orders addressed:  1. Essential hypertension Low sodium diet - amLODipine (NORVASC) 5 MG tablet; Take 1 tablet (5 mg total) by mouth daily.  Dispense: 90 tablet; Refill: 1 - lisinopril-hydrochlorothiazide (PRINZIDE,ZESTORETIC) 20-12.5 MG tablet; TAKE 2 TABLETS EVERY DAY  Dispense: 180 tablet; Refill: 1 - CMP14+EGFR  2. Gastroesophageal reflux disease without esophagitis Avoid spicy foods Do not eat 2 hours prior to bedtime - omeprazole (PRILOSEC) 40 MG capsule; TAKE 1 CAPSULE EVERY DAY  Dispense: 90 capsule; Refill: 1  3. Hypothyroidism, unspecified type - Thyroid Panel With TSH  4. Thrombocytosis ([facility]) - ferrous sulfate 325 (65 FE) MG EC tablet; Take 1 tablet (325 mg total) by mouth 2 (two) times daily before a meal.  Dispense: 90 tablet; Refill: 1  5. Primary insomnia Bedtime routine Continue unisom OTC  6. Pure hypercholesterolemia Low fat diet - atorvastatin (LIPITOR) 40 MG tablet; TAKE 1 TABLET EVERY DAY (NEED TO BE SEEN)  Dispense: 90 tablet; Refill: 1 - Lipid panel  7. BMI 28.0-28.9,adult Discussed diet and exercise for person with BMI >25 Will recheck weight in 3-6 months  8. Acquired hypothyroidism - levothyroxine (SYNTHROID, LEVOTHROID) 100 MCG tablet; Take 1 tablet (100 mcg total) by mouth daily.  Dispense: 90 tablet; Refill: 1   Labs pending Health Maintenance reviewed Diet and exercise encouraged  Follow up plan: 6 months   Mary-Margaret Hassell Done, FNP

## 2018-07-10 LAB — THYROID PANEL WITH TSH
Free Thyroxine Index: 1.6 (ref 1.2–4.9)
T3 UPTAKE RATIO: 22 % — AB (ref 24–39)
T4, Total: 7.3 ug/dL (ref 4.5–12.0)
TSH: 2.6 u[IU]/mL (ref 0.450–4.500)

## 2018-07-10 LAB — CMP14+EGFR
ALT: 79 IU/L — ABNORMAL HIGH (ref 0–32)
AST: 55 IU/L — ABNORMAL HIGH (ref 0–40)
Albumin/Globulin Ratio: 1.9 (ref 1.2–2.2)
Albumin: 4.7 g/dL (ref 3.6–4.8)
Alkaline Phosphatase: 79 IU/L (ref 39–117)
BUN / CREAT RATIO: 16 (ref 12–28)
BUN: 15 mg/dL (ref 8–27)
Bilirubin Total: 0.4 mg/dL (ref 0.0–1.2)
CO2: 23 mmol/L (ref 20–29)
Calcium: 9.8 mg/dL (ref 8.7–10.3)
Chloride: 99 mmol/L (ref 96–106)
Creatinine, Ser: 0.92 mg/dL (ref 0.57–1.00)
GFR, EST AFRICAN AMERICAN: 74 mL/min/{1.73_m2} (ref >59)
GFR, EST NON AFRICAN AMERICAN: 64 mL/min/{1.73_m2} (ref >59)
Globulin, Total: 2.5 g/dL (ref 1.5–4.5)
Glucose: 95 mg/dL (ref 65–99)
Potassium: 4.1 mmol/L (ref 3.5–5.2)
Sodium: 140 mmol/L (ref 134–144)
TOTAL PROTEIN: 7.2 g/dL (ref 6.0–8.5)

## 2018-07-10 LAB — LIPID PANEL
Chol/HDL Ratio: 3.3 ratio (ref 0.0–4.4)
Cholesterol, Total: 167 mg/dL (ref 100–199)
HDL: 50 mg/dL (ref >39)
LDL Calculated: 74 mg/dL (ref 0–99)
Triglycerides: 215 mg/dL — ABNORMAL HIGH (ref 0–149)
VLDL Cholesterol Cal: 43 mg/dL — ABNORMAL HIGH (ref 5–40)

## 2018-07-11 ENCOUNTER — Other Ambulatory Visit: Payer: Self-pay | Admitting: Nurse Practitioner

## 2018-07-11 DIAGNOSIS — I10 Essential (primary) hypertension: Secondary | ICD-10-CM

## 2018-07-11 DIAGNOSIS — E039 Hypothyroidism, unspecified: Secondary | ICD-10-CM

## 2018-07-11 DIAGNOSIS — K219 Gastro-esophageal reflux disease without esophagitis: Secondary | ICD-10-CM

## 2018-07-11 DIAGNOSIS — M1990 Unspecified osteoarthritis, unspecified site: Secondary | ICD-10-CM

## 2018-07-11 DIAGNOSIS — E7800 Pure hypercholesterolemia, unspecified: Secondary | ICD-10-CM

## 2019-01-03 DIAGNOSIS — D2272 Melanocytic nevi of left lower limb, including hip: Secondary | ICD-10-CM | POA: Diagnosis not present

## 2019-01-03 DIAGNOSIS — L821 Other seborrheic keratosis: Secondary | ICD-10-CM | POA: Diagnosis not present

## 2019-01-03 DIAGNOSIS — D1801 Hemangioma of skin and subcutaneous tissue: Secondary | ICD-10-CM | POA: Diagnosis not present

## 2019-01-03 DIAGNOSIS — D2262 Melanocytic nevi of left upper limb, including shoulder: Secondary | ICD-10-CM | POA: Diagnosis not present

## 2019-01-03 DIAGNOSIS — D2261 Melanocytic nevi of right upper limb, including shoulder: Secondary | ICD-10-CM | POA: Diagnosis not present

## 2019-01-03 DIAGNOSIS — D225 Melanocytic nevi of trunk: Secondary | ICD-10-CM | POA: Diagnosis not present

## 2019-01-03 DIAGNOSIS — D224 Melanocytic nevi of scalp and neck: Secondary | ICD-10-CM | POA: Diagnosis not present

## 2019-01-03 DIAGNOSIS — Z85820 Personal history of malignant melanoma of skin: Secondary | ICD-10-CM | POA: Diagnosis not present

## 2019-01-03 DIAGNOSIS — D2239 Melanocytic nevi of other parts of face: Secondary | ICD-10-CM | POA: Diagnosis not present

## 2019-02-17 ENCOUNTER — Other Ambulatory Visit: Payer: Self-pay | Admitting: Nurse Practitioner

## 2019-02-17 DIAGNOSIS — M1990 Unspecified osteoarthritis, unspecified site: Secondary | ICD-10-CM

## 2019-03-01 ENCOUNTER — Other Ambulatory Visit: Payer: Self-pay | Admitting: Nurse Practitioner

## 2019-03-01 DIAGNOSIS — K219 Gastro-esophageal reflux disease without esophagitis: Secondary | ICD-10-CM

## 2019-03-11 DIAGNOSIS — R69 Illness, unspecified: Secondary | ICD-10-CM | POA: Diagnosis not present

## 2019-05-28 ENCOUNTER — Other Ambulatory Visit: Payer: Self-pay | Admitting: Nurse Practitioner

## 2019-05-28 DIAGNOSIS — E7800 Pure hypercholesterolemia, unspecified: Secondary | ICD-10-CM

## 2019-05-28 DIAGNOSIS — E039 Hypothyroidism, unspecified: Secondary | ICD-10-CM

## 2019-05-28 DIAGNOSIS — I10 Essential (primary) hypertension: Secondary | ICD-10-CM

## 2019-05-30 ENCOUNTER — Other Ambulatory Visit: Payer: Self-pay | Admitting: Nurse Practitioner

## 2019-05-30 DIAGNOSIS — K219 Gastro-esophageal reflux disease without esophagitis: Secondary | ICD-10-CM

## 2019-08-23 ENCOUNTER — Ambulatory Visit: Payer: Medicare HMO

## 2019-08-25 ENCOUNTER — Other Ambulatory Visit: Payer: Self-pay | Admitting: Nurse Practitioner

## 2019-08-25 DIAGNOSIS — M1990 Unspecified osteoarthritis, unspecified site: Secondary | ICD-10-CM

## 2019-09-04 ENCOUNTER — Other Ambulatory Visit: Payer: Self-pay | Admitting: Nurse Practitioner

## 2019-09-04 DIAGNOSIS — M1990 Unspecified osteoarthritis, unspecified site: Secondary | ICD-10-CM

## 2019-09-16 ENCOUNTER — Ambulatory Visit (INDEPENDENT_AMBULATORY_CARE_PROVIDER_SITE_OTHER): Payer: Medicare HMO | Admitting: *Deleted

## 2019-09-16 DIAGNOSIS — Z0000 Encounter for general adult medical examination without abnormal findings: Secondary | ICD-10-CM | POA: Diagnosis not present

## 2019-09-16 NOTE — Patient Instructions (Signed)
Preventive Care 38 Years and Older, Female Preventive care refers to lifestyle choices and visits with your health care provider that can promote health and wellness. This includes:  A yearly physical exam. This is also called an annual well check.  Regular dental and eye exams.  Immunizations.  Screening for certain conditions.  Healthy lifestyle choices, such as diet and exercise. What can I expect for my preventive care visit? Physical exam Your health care provider will check:  Height and weight. These may be used to calculate body mass index (BMI), which is a measurement that tells if you are at a healthy weight.  Heart rate and blood pressure.  Your skin for abnormal spots. Counseling Your health care provider may ask you questions about:  Alcohol, tobacco, and drug use.  Emotional well-being.  Home and relationship well-being.  Sexual activity.  Eating habits.  History of falls.  Memory and ability to understand (cognition).  Work and work Statistician.  Pregnancy and menstrual history. What immunizations do I need?  Influenza (flu) vaccine  This is recommended every year. Tetanus, diphtheria, and pertussis (Tdap) vaccine  You may need a Td booster every 10 years. Varicella (chickenpox) vaccine  You may need this vaccine if you have not already been vaccinated. Zoster (shingles) vaccine  You may need this after age 33. Pneumococcal conjugate (PCV13) vaccine  One dose is recommended after age 33. Pneumococcal polysaccharide (PPSV23) vaccine  One dose is recommended after age 72. Measles, mumps, and rubella (MMR) vaccine  You may need at least one dose of MMR if you were born in 1957 or later. You may also need a second dose. Meningococcal conjugate (MenACWY) vaccine  You may need this if you have certain conditions. Hepatitis A vaccine  You may need this if you have certain conditions or if you travel or work in places where you may be exposed  to hepatitis A. Hepatitis B vaccine  You may need this if you have certain conditions or if you travel or work in places where you may be exposed to hepatitis B. Haemophilus influenzae type b (Hib) vaccine  You may need this if you have certain conditions. You may receive vaccines as individual doses or as more than one vaccine together in one shot (combination vaccines). Talk with your health care provider about the risks and benefits of combination vaccines. What tests do I need? Blood tests  Lipid and cholesterol levels. These may be checked every 5 years, or more frequently depending on your overall health.  Hepatitis C test.  Hepatitis B test. Screening  Lung cancer screening. You may have this screening every year starting at age 39 if you have a 30-pack-year history of smoking and currently smoke or have quit within the past 15 years.  Colorectal cancer screening. All adults should have this screening starting at age 36 and continuing until age 15. Your health care provider may recommend screening at age 23 if you are at increased risk. You will have tests every 1-10 years, depending on your results and the type of screening test.  Diabetes screening. This is done by checking your blood sugar (glucose) after you have not eaten for a while (fasting). You may have this done every 1-3 years.  Mammogram. This may be done every 1-2 years. Talk with your health care provider about how often you should have regular mammograms.  BRCA-related cancer screening. This may be done if you have a family history of breast, ovarian, tubal, or peritoneal cancers.  Other tests  Sexually transmitted disease (STD) testing.  Bone density scan. This is done to screen for osteoporosis. You may have this done starting at age 44. Follow these instructions at home: Eating and drinking  Eat a diet that includes fresh fruits and vegetables, whole grains, lean protein, and low-fat dairy products. Limit  your intake of foods with high amounts of sugar, saturated fats, and salt.  Take vitamin and mineral supplements as recommended by your health care provider.  Do not drink alcohol if your health care provider tells you not to drink.  If you drink alcohol: ? Limit how much you have to 0-1 drink a day. ? Be aware of how much alcohol is in your drink. In the [place] one drink equals one 12 oz bottle of beer (355 mL), one 5 oz glass of wine (148 mL), or one 1 oz glass of hard liquor (44 mL). Lifestyle  Take daily care of your teeth and gums.  Stay active. Exercise for at least 30 minutes on 5 or more days each week.  Do not use any products that contain nicotine or tobacco, such as cigarettes, e-cigarettes, and chewing tobacco. If you need help quitting, ask your health care provider.  If you are sexually active, practice safe sex. Use a condom or other form of protection in order to prevent STIs (sexually transmitted infections).  Talk with your health care provider about taking a low-dose aspirin or statin. What's next?  Go to your health care provider once a year for a well check visit.  Ask your health care provider how often you should have your eyes and teeth checked.  Stay up to date on all vaccines. This information is not intended to replace advice given to you by your health care provider. Make sure you discuss any questions you have with your health care provider. Document Revised: 06/10/2018 Document Reviewed: 06/10/2018 Elsevier Patient Education  2020 [employer].

## 2019-09-16 NOTE — Progress Notes (Signed)
MEDICARE ANNUAL WELLNESS VISIT  09/16/2019  Telephone Visit Disclaimer This Medicare AWV was conducted by telephone due to national recommendations for restrictions regarding the COVID-19 Pandemic (e.g. social distancing).  I verified, using two identifiers, that I am speaking with Gabriela Gardner or their authorized healthcare agent. I discussed the limitations, risks, security, and privacy concerns of performing an evaluation and management service by telephone and the potential availability of an in-person appointment in the future. The patient expressed understanding and agreed to proceed.   Subjective:  Gabriela Gardner is a 69 y.o. female patient of Chevis Pretty, [place] who had a Medicare Annual Wellness Visit today via telephone. Poetry is Retired and lives with their spouse. she has 2 children. she reports that she is socially active and does interact with friends/family regularly. she is moderately physically active and enjoys reading, crafting, yard work and working in her Programmer, multimedia garden.  Patient Care Team: Chevis Pretty, FNP as PCP - General (Nurse Practitioner)  Advanced Directives 09/16/2019 11/11/2017 09/29/2017  Does Patient Have a Medical Advance Directive? No No No  Would patient like information on creating a medical advance directive? No - Patient declined No - Patient declined No - Patient declined    Hospital Utilization Over the Past 12 Months: # of hospitalizations or ER visits: 0 # of surgeries: 0  Review of Systems    Patient reports that her overall health is unchanged compared to last year.  History obtained from chart review  Patient Reported Readings (BP, Pulse, CBG, Weight, etc) none  Pain Assessment Pain : No/denies pain     Current Medications & Allergies (verified) Allergies as of 09/16/2019      Reactions   Codeine Rash      Medication List       Accurate as of September 16, 2019  9:31 AM. If you have any questions, ask your nurse or  doctor.        amLODipine 5 MG tablet Commonly known as: NORVASC TAKE 1 TABLET DAILY   atorvastatin 40 MG tablet Commonly known as: LIPITOR TAKE 1 TABLET DAILY NEED TOBE SEEN   calcium carbonate 600 MG Tabs tablet Commonly known as: OS-CAL Take 600 mg by mouth daily.   ferrous sulfate 325 (65 FE) MG EC tablet Take 1 tablet (325 mg total) by mouth 2 (two) times daily before a meal.   levothyroxine 100 MCG tablet Commonly known as: SYNTHROID TAKE 1 TABLET DAILY   lisinopril-hydrochlorothiazide 20-12.5 MG tablet Commonly known as: ZESTORETIC TAKE 2 TABLETS DAILY   meloxicam 15 MG tablet Commonly known as: MOBIC TAKE 1 TABLET DAILY   multivitamin tablet Take 1 tablet by mouth daily.   naproxen sodium 220 MG tablet Commonly known as: ALEVE Take 220 mg by mouth.   omeprazole 40 MG capsule Commonly known as: PRILOSEC TAKE 1 CAPSULE DAILY       History (reviewed): Past Medical History:  Diagnosis Date  . Hyperlipidemia   . Hypertension   . Skin cancer (melanoma) ([place])    left upper arm  . Thyroid disease    Past Surgical History:  Procedure Laterality Date  . ABDOMINAL HYSTERECTOMY    . SKIN LESION EXCISION     Family History  Adopted: Yes  Problem Relation Age of Onset  . Asthma Son   . Turner syndrome Daughter    Social History   Socioeconomic History  . Marital status: Married    Spouse name: Herbie [place]  . Number of children: 2  .  Years of education: 22  . Highest education level: High school graduate  Occupational History  . Occupation: retired  Tobacco Use  . Smoking status: Former Smoker    Packs/day: 1.00    Years: 30.00    Pack years: 30.00    Types: Cigarettes    Quit date: 06/30/2006    Years since quitting: 13.2  . Smokeless tobacco: Never Used  Substance and Sexual Activity  . Alcohol use: Yes    Comment: occasional  . Drug use: No  . Sexual activity: Yes    Birth control/protection: Post-menopausal  Other Topics Concern  . Not  on file  Social History Narrative  . Not on file   Social Determinants of Health   Financial Resource Strain: Low Risk   . Difficulty of Paying Living Expenses: Not hard at all  Food Insecurity: No Food Insecurity  . Worried About Charity fundraiser in the Last Year: Never true  . Ran Out of Food in the Last Year: Never true  Transportation Needs: No Transportation Needs  . Lack of Transportation (Medical): No  . Lack of Transportation (Non-Medical): No  Physical Activity: Sufficiently Active  . Days of Exercise per Week: 5 days  . Minutes of Exercise per Session: 30 min  Stress: No Stress Concern Present  . Feeling of Stress : Not at all  Social Connections: Not Isolated  . Frequency of Communication with Friends and Family: More than three times a week  . Frequency of Social Gatherings with Friends and Family: More than three times a week  . Attends Religious Services: More than 4 times per year  . Active Member of Clubs or Organizations: Yes  . Attends Archivist Meetings: More than 4 times per year  . Marital Status: Married    Activities of Daily Living In your present state of health, do you have any difficulty performing the following activities: 09/16/2019  Hearing? N  Vision? N  Comment wears glasses-last eye exam 2 years ago  Difficulty concentrating or making decisions? N  Walking or climbing stairs? N  Dressing or bathing? N  Doing errands, shopping? N  Preparing Food and eating ? N  Using the Toilet? N  In the past six months, have you accidently leaked urine? Y  Comment stress incontinence-sneezing/coughing  Do you have problems with loss of bowel control? N  Managing your Medications? N  Managing your Finances? N  Housekeeping or managing your Housekeeping? N  Some recent data might be hidden    Patient Education/ Literacy How often do you need to have someone help you when you read instructions, pamphlets, or other written materials from your  doctor or pharmacy?: 1 - Never What is the last grade level you completed in school?: 12th grade  Exercise Current Exercise Habits: Home exercise routine, Type of exercise: treadmill, Time (Minutes): 30, Frequency (Times/Week): 5, Weekly Exercise (Minutes/Week): 150, Intensity: Mild, Exercise limited by: None identified  Diet Patient reports consuming 2 meals a day and 1 snack(s) a day Patient reports that her primary diet is: Regular Patient reports that she does have regular access to food.   Depression Screen PHQ 2/9 Scores 09/16/2019 07/09/2018 01/22/2018 01/09/2017 09/19/2016 10/11/2015 04/11/2015  PHQ - 2 Score 0 0 0 0 0 0 0     Fall Risk Fall Risk  09/16/2019 07/09/2018 01/22/2018 01/09/2017 09/19/2016  Falls in the past year? 0 0 No No No  Number falls in past yr: - - - - -  Injury with Fall? - - - - -  Comment - - - - -     Objective:  Gabriela Gardner seemed alert and oriented and she participated appropriately during our telephone visit.  Blood Pressure Weight BMI  BP Readings from Last 3 Encounters:  07/09/18 129/78  01/22/18 135/75  11/11/17 (!) 146/65   Wt Readings from Last 3 Encounters:  01/22/18 170 lb (77.1 kg)  11/11/17 169 lb 3.2 oz (76.7 kg)  09/29/17 164 lb 6.4 oz (74.6 kg)   BMI Readings from Last 1 Encounters:  01/22/18 29.18 kg/m    *Unable to obtain current vital signs, weight, and BMI due to telephone visit type  Hearing/Vision  . Eddy did not seem to have difficulty with hearing/understanding during the telephone conversation . Reports that she has not had a formal eye exam by an eye care professional within the past year . Reports that she has not had a formal hearing evaluation within the past year *Unable to fully assess hearing and vision during telephone visit type  Cognitive Function: 6CIT Screen 09/16/2019  What Year? 0 points  What month? 0 points  What time? 0 points  Count back from 20 0 points  Months in reverse 0 points  Repeat phrase 0  points  Total Score 0   (Normal:0-7, Significant for Dysfunction: >8)  Normal Cognitive Function Screening: Yes   Immunization & Health Maintenance Record Immunization History  Administered Date(s) Administered  . Influenza, High Dose Seasonal PF 04/01/2016  . Influenza,inj,Quad PF,6+ Mos 09/07/2014, 04/11/2015  . Influenza-Unspecified 03/13/2017, 03/01/2019  . Pneumococcal Conjugate-13 12/27/2014  . Pneumococcal Polysaccharide-23 09/19/2016  . Zoster 02/22/2014    Health Maintenance  Topic Date Due  . Fecal DNA (Cologuard)  Never done  . TETANUS/TDAP  02/29/2016  . MAMMOGRAM  11/27/2019  . INFLUENZA VACCINE  Completed  . DEXA SCAN  Completed  . Hepatitis C Screening  Completed  . PNA vac Low Risk Adult  Completed       Assessment  This is a routine wellness examination for Gabriela Gardner.  Health Maintenance: Due or Overdue Health Maintenance Due  Topic Date Due  . Fecal DNA (Cologuard)  Never done  . TETANUS/TDAP  02/29/2016    Gabriela Gardner does not need a referral for Community Assistance: Care Management:   no Social Work:    no Prescription Assistance:  no Nutrition/Diabetes Education:  no   Plan:  Personalized Goals Goals Addressed            This Visit's Progress   . DIET - INCREASE WATER INTAKE       Try to drink 6-8 glasses of water daily      Personalized Health Maintenance & Screening Recommendations  Td vaccine  Lung Cancer Screening Recommended: no (Low Dose CT Chest recommended if Age 49-80 years, 30 pack-year currently smoking OR have quit w/in past 15 years) Hepatitis C Screening recommended: no HIV Screening recommended: no  Advanced Directives: Written information was not prepared per patient's request.  Referrals & Orders No orders of the defined types were placed in this encounter.   Follow-up Plan . Follow-up with Chevis Pretty, FNP as planned . Consider TDAP vaccine at your next visit with your PCP   I have  personally reviewed and noted the following in the patient's chart:   . Medical and social history . Use of alcohol, tobacco or illicit drugs  . Current medications and supplements . Functional ability and status . Nutritional status .  Physical activity . Advanced directives . List of other physicians . Hospitalizations, surgeries, and ER visits in previous 12 months . Vitals . Screenings to include cognitive, depression, and falls . Referrals and appointments  In addition, I have reviewed and discussed with Gabriela Gardner certain preventive protocols, quality metrics, and best practice recommendations. A written personalized care plan for preventive services as well as general preventive health recommendations is available and can be mailed to the patient at her request.      Milas Hock, LPN  [id number]

## 2019-09-21 ENCOUNTER — Other Ambulatory Visit: Payer: Self-pay | Admitting: Nurse Practitioner

## 2019-09-21 DIAGNOSIS — K219 Gastro-esophageal reflux disease without esophagitis: Secondary | ICD-10-CM

## 2019-10-14 ENCOUNTER — Other Ambulatory Visit: Payer: Self-pay | Admitting: Nurse Practitioner

## 2019-10-14 DIAGNOSIS — K219 Gastro-esophageal reflux disease without esophagitis: Secondary | ICD-10-CM

## 2019-10-27 DIAGNOSIS — H269 Unspecified cataract: Secondary | ICD-10-CM | POA: Diagnosis not present

## 2019-10-27 DIAGNOSIS — H524 Presbyopia: Secondary | ICD-10-CM | POA: Diagnosis not present

## 2019-10-27 DIAGNOSIS — H52209 Unspecified astigmatism, unspecified eye: Secondary | ICD-10-CM | POA: Diagnosis not present

## 2019-10-27 DIAGNOSIS — H5213 Myopia, bilateral: Secondary | ICD-10-CM | POA: Diagnosis not present

## 2019-10-28 ENCOUNTER — Other Ambulatory Visit: Payer: Self-pay

## 2019-10-28 ENCOUNTER — Encounter: Payer: Self-pay | Admitting: Nurse Practitioner

## 2019-10-28 ENCOUNTER — Ambulatory Visit (INDEPENDENT_AMBULATORY_CARE_PROVIDER_SITE_OTHER): Payer: Medicare HMO | Admitting: Nurse Practitioner

## 2019-10-28 VITALS — BP 116/66 | HR 61 | Temp 97.6°F | Ht 64.0 in | Wt 168.2 lb

## 2019-10-28 DIAGNOSIS — Z6828 Body mass index (BMI) 28.0-28.9, adult: Secondary | ICD-10-CM | POA: Diagnosis not present

## 2019-10-28 DIAGNOSIS — E7800 Pure hypercholesterolemia, unspecified: Secondary | ICD-10-CM

## 2019-10-28 DIAGNOSIS — R69 Illness, unspecified: Secondary | ICD-10-CM | POA: Diagnosis not present

## 2019-10-28 DIAGNOSIS — I10 Essential (primary) hypertension: Secondary | ICD-10-CM

## 2019-10-28 DIAGNOSIS — D75839 Thrombocytosis, unspecified: Secondary | ICD-10-CM

## 2019-10-28 DIAGNOSIS — D473 Essential (hemorrhagic) thrombocythemia: Secondary | ICD-10-CM | POA: Diagnosis not present

## 2019-10-28 DIAGNOSIS — E039 Hypothyroidism, unspecified: Secondary | ICD-10-CM | POA: Diagnosis not present

## 2019-10-28 DIAGNOSIS — K219 Gastro-esophageal reflux disease without esophagitis: Secondary | ICD-10-CM | POA: Diagnosis not present

## 2019-10-28 DIAGNOSIS — M1990 Unspecified osteoarthritis, unspecified site: Secondary | ICD-10-CM | POA: Diagnosis not present

## 2019-10-28 DIAGNOSIS — F5101 Primary insomnia: Secondary | ICD-10-CM

## 2019-10-28 MED ORDER — OMEPRAZOLE 40 MG PO CPDR: DELAYED_RELEASE_CAPSULE | ORAL | 0 refills | Status: DC

## 2019-10-28 MED ORDER — LEVOTHYROXINE SODIUM 100 MCG PO TABS: 100.0000 ug | ORAL_TABLET | Freq: Every day | ORAL | 1 refills | Status: DC

## 2019-10-28 MED ORDER — MELOXICAM 15 MG PO TABS: 15.0000 mg | ORAL_TABLET | Freq: Every day | ORAL | 1 refills | Status: DC

## 2019-10-28 MED ORDER — LISINOPRIL-HYDROCHLOROTHIAZIDE 20-12.5 MG PO TABS: 2.0000 | ORAL_TABLET | Freq: Every day | ORAL | 1 refills | Status: DC

## 2019-10-28 MED ORDER — ATORVASTATIN CALCIUM 40 MG PO TABS: 40.0000 mg | ORAL_TABLET | Freq: Every day | ORAL | 1 refills | Status: DC

## 2019-10-28 MED ORDER — AMLODIPINE BESYLATE 5 MG PO TABS: 5.0000 mg | ORAL_TABLET | Freq: Every day | ORAL | 1 refills | Status: DC

## 2019-10-28 NOTE — Patient Instructions (Signed)
Exercising to Stay Healthy To become healthy and stay healthy, it is recommended that you do moderate-intensity and vigorous-intensity exercise. You can tell that you are exercising at a moderate intensity if your heart starts beating faster and you start breathing faster but can still hold a conversation. You can tell that you are exercising at a vigorous intensity if you are breathing much harder and faster and cannot hold a conversation while exercising. Exercising regularly is important. It has many health benefits, such as:  Improving overall fitness, flexibility, and endurance.  Increasing bone density.  Helping with weight control.  Decreasing body fat.  Increasing muscle strength.  Reducing stress and tension.  Improving overall health. How often should I exercise? Choose an activity that you enjoy, and set realistic goals. Your health care provider can help you make an activity plan that works for you. Exercise regularly as told by your health care provider. This may include:  Doing strength training two times a week, such as: ? Lifting weights. ? Using resistance bands. ? Push-ups. ? Sit-ups. ? Yoga.  Doing a certain intensity of exercise for a given amount of time. Choose from these options: ? A total of 150 minutes of moderate-intensity exercise every week. ? A total of 75 minutes of vigorous-intensity exercise every week. ? A mix of moderate-intensity and vigorous-intensity exercise every week. Children, pregnant women, people who have not exercised regularly, people who are overweight, and older adults may need to talk with a health care provider about what activities are safe to do. If you have a medical condition, be sure to talk with your health care provider before you start a new exercise program. What are some exercise ideas? Moderate-intensity exercise ideas include:  Walking 1 mile (1.6 km) in about 15  minutes.  Biking.  Hiking.  Golfing.  Dancing.  Water aerobics. Vigorous-intensity exercise ideas include:  Walking 4.5 miles (7.2 km) or more in about 1 hour.  Jogging or running 5 miles (8 km) in about 1 hour.  Biking 10 miles (16.1 km) or more in about 1 hour.  Lap swimming.  Roller-skating or in-line skating.  Cross-country skiing.  Vigorous competitive sports, such as football, basketball, and soccer.  Jumping rope.  Aerobic dancing. What are some everyday activities that can help me to get exercise?  Yard work, such as: ? Pushing a lawn mower. ? Raking and bagging leaves.  Washing your car.  Pushing a stroller.  Shoveling snow.  Gardening.  Washing windows or floors. How can I be more active in my day-to-day activities?  Use stairs instead of an elevator.  Take a walk during your lunch break.  If you drive, park your car farther away from your work or school.  If you take public transportation, get off one stop early and walk the rest of the way.  Stand up or walk around during all of your indoor phone calls.  Get up, stretch, and walk around every 30 minutes throughout the day.  Enjoy exercise with a friend. Support to continue exercising will help you keep a regular routine of activity. What guidelines can I follow while exercising?  Before you start a new exercise program, talk with your health care provider.  Do not exercise so much that you hurt yourself, feel dizzy, or get very short of breath.  Wear comfortable clothes and wear shoes with good support.  Drink plenty of water while you exercise to prevent dehydration or heat stroke.  Work out until your breathing   and your heartbeat get faster. Where to find more information  [place] Department of Health and Human Services: www.hhs.gov  Centers for Disease Control and Prevention (CDC): www.cdc.gov Summary  Exercising regularly is important. It will improve your overall fitness,  flexibility, and endurance.  Regular exercise also will improve your overall health. It can help you control your weight, reduce stress, and improve your bone density.  Do not exercise so much that you hurt yourself, feel dizzy, or get very short of breath.  Before you start a new exercise program, talk with your health care provider. This information is not intended to replace advice given to you by your health care provider. Make sure you discuss any questions you have with your health care provider. Document Revised: 05/29/2017 Document Reviewed: 05/07/2017 Elsevier Patient Education  2020 Elsevier Inc.  
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
## 2019-10-29 LAB — CMP14+EGFR
ALT: 73 IU/L — ABNORMAL HIGH (ref 0–32)
AST: 52 IU/L — ABNORMAL HIGH (ref 0–40)
Albumin/Globulin Ratio: 1.7 (ref 1.2–2.2)
Albumin: 4.6 g/dL (ref 3.8–4.8)
Alkaline Phosphatase: 82 IU/L (ref 39–117)
BUN/Creatinine Ratio: 12 (ref 12–28)
BUN: 13 mg/dL (ref 8–27)
Bilirubin Total: 0.8 mg/dL (ref 0.0–1.2)
CO2: 22 mmol/L (ref 20–29)
Calcium: 9.8 mg/dL (ref 8.7–10.3)
Chloride: 100 mmol/L (ref 96–106)
Creatinine, Ser: 1.12 mg/dL — ABNORMAL HIGH (ref 0.57–1.00)
GFR calc Af Amer: 58 mL/min/{1.73_m2} — ABNORMAL LOW (ref >59)
GFR calc non Af Amer: 50 mL/min/{1.73_m2} — ABNORMAL LOW (ref >59)
Globulin, Total: 2.7 g/dL (ref 1.5–4.5)
Glucose: 117 mg/dL — ABNORMAL HIGH (ref 65–99)
Potassium: 4.3 mmol/L (ref 3.5–5.2)
Sodium: 139 mmol/L (ref 134–144)
Total Protein: 7.3 g/dL (ref 6.0–8.5)

## 2019-10-29 LAB — THYROID PANEL WITH TSH
Free Thyroxine Index: 2 (ref 1.2–4.9)
T3 Uptake Ratio: 25 % (ref 24–39)
T4, Total: 7.9 ug/dL (ref 4.5–12.0)
TSH: 1.65 u[IU]/mL (ref 0.450–4.500)

## 2019-10-29 LAB — CBC WITH DIFFERENTIAL/PLATELET
Basophils Absolute: 0.1 10*3/uL (ref 0.0–0.2)
Basos: 1 %
EOS (ABSOLUTE): 0.3 10*3/uL (ref 0.0–0.4)
Eos: 4 %
Hematocrit: 40.4 % (ref 34.0–46.6)
Hemoglobin: 13.9 g/dL (ref 11.1–15.9)
Immature Grans (Abs): 0 10*3/uL (ref 0.0–0.1)
Immature Granulocytes: 0 %
Lymphocytes Absolute: 1.3 10*3/uL (ref 0.7–3.1)
Lymphs: 21 %
MCH: 32.2 pg (ref 26.6–33.0)
MCHC: 34.4 g/dL (ref 31.5–35.7)
MCV: 94 fL (ref 79–97)
Monocytes Absolute: 0.6 10*3/uL (ref 0.1–0.9)
Monocytes: 9 %
Neutrophils Absolute: 4 10*3/uL (ref 1.4–7.0)
Neutrophils: 65 %
Platelets: 250 10*3/uL (ref 150–450)
RBC: 4.32 x10E6/uL (ref 3.77–5.28)
RDW: 12.2 % (ref 11.7–15.4)
WBC: 6.1 10*3/uL (ref 3.4–10.8)

## 2019-10-29 LAB — LIPID PANEL
Chol/HDL Ratio: 3.1 ratio (ref 0.0–4.4)
Cholesterol, Total: 153 mg/dL (ref 100–199)
HDL: 49 mg/dL (ref >39)
LDL Chol Calc (NIH): 72 mg/dL (ref 0–99)
Triglycerides: 191 mg/dL — ABNORMAL HIGH (ref 0–149)
VLDL Cholesterol Cal: 32 mg/dL (ref 5–40)

## 2019-11-14 ENCOUNTER — Other Ambulatory Visit: Payer: Self-pay | Admitting: Nurse Practitioner

## 2019-11-14 DIAGNOSIS — K219 Gastro-esophageal reflux disease without esophagitis: Secondary | ICD-10-CM

## 2019-12-29 ENCOUNTER — Other Ambulatory Visit: Payer: Self-pay | Admitting: Nurse Practitioner

## 2019-12-29 DIAGNOSIS — Z1231 Encounter for screening mammogram for malignant neoplasm of breast: Secondary | ICD-10-CM

## 2020-01-03 DIAGNOSIS — D1801 Hemangioma of skin and subcutaneous tissue: Secondary | ICD-10-CM | POA: Diagnosis not present

## 2020-01-03 DIAGNOSIS — D2261 Melanocytic nevi of right upper limb, including shoulder: Secondary | ICD-10-CM | POA: Diagnosis not present

## 2020-01-03 DIAGNOSIS — D2221 Melanocytic nevi of right ear and external auricular canal: Secondary | ICD-10-CM | POA: Diagnosis not present

## 2020-01-03 DIAGNOSIS — L821 Other seborrheic keratosis: Secondary | ICD-10-CM | POA: Diagnosis not present

## 2020-01-03 DIAGNOSIS — L814 Other melanin hyperpigmentation: Secondary | ICD-10-CM | POA: Diagnosis not present

## 2020-01-03 DIAGNOSIS — L570 Actinic keratosis: Secondary | ICD-10-CM | POA: Diagnosis not present

## 2020-01-03 DIAGNOSIS — Z85820 Personal history of malignant melanoma of skin: Secondary | ICD-10-CM | POA: Diagnosis not present

## 2020-01-03 DIAGNOSIS — D225 Melanocytic nevi of trunk: Secondary | ICD-10-CM | POA: Diagnosis not present

## 2020-01-25 ENCOUNTER — Other Ambulatory Visit: Payer: Self-pay

## 2020-01-25 ENCOUNTER — Ambulatory Visit
Admission: RE | Admit: 2020-01-25 | Discharge: 2020-01-25 | Disposition: A | Discharge status: Home / Self Care | Payer: Medicare HMO | Source: Ambulatory Visit | Attending: Nurse Practitioner | Admitting: Nurse Practitioner

## 2020-01-25 DIAGNOSIS — Z1231 Encounter for screening mammogram for malignant neoplasm of breast: Secondary | ICD-10-CM

## 2020-03-23 DIAGNOSIS — R69 Illness, unspecified: Secondary | ICD-10-CM | POA: Diagnosis not present

## 2020-04-13 ENCOUNTER — Other Ambulatory Visit: Payer: Self-pay | Admitting: *Deleted

## 2020-04-13 DIAGNOSIS — M1990 Unspecified osteoarthritis, unspecified site: Secondary | ICD-10-CM

## 2020-04-13 MED ORDER — MELOXICAM 15 MG PO TABS: 15.0000 mg | ORAL_TABLET | Freq: Every day | ORAL | 0 refills | Status: DC

## 2020-04-16 MED ORDER — MELOXICAM 15 MG PO TABS: 15.0000 mg | ORAL_TABLET | Freq: Every day | ORAL | 0 refills | Status: DC

## 2020-04-16 NOTE — Telephone Encounter (Signed)
E-prescribe down. resent 

## 2020-04-16 NOTE — Addendum Note (Signed)
Addended by: Antonietta Barcelona D on: 04/16/2020 10:38 AM   Modules accepted: Orders

## 2020-05-01 ENCOUNTER — Encounter: Payer: Self-pay | Admitting: Nurse Practitioner

## 2020-05-01 ENCOUNTER — Ambulatory Visit (INDEPENDENT_AMBULATORY_CARE_PROVIDER_SITE_OTHER): Payer: Medicare HMO | Admitting: Nurse Practitioner

## 2020-05-01 ENCOUNTER — Other Ambulatory Visit: Payer: Self-pay

## 2020-05-01 VITALS — BP 142/80 | HR 70 | Temp 97.9°F | Resp 20 | Ht 64.0 in | Wt 166.0 lb

## 2020-05-01 DIAGNOSIS — E039 Hypothyroidism, unspecified: Secondary | ICD-10-CM | POA: Diagnosis not present

## 2020-05-01 DIAGNOSIS — F5101 Primary insomnia: Secondary | ICD-10-CM

## 2020-05-01 DIAGNOSIS — Z6828 Body mass index (BMI) 28.0-28.9, adult: Secondary | ICD-10-CM | POA: Diagnosis not present

## 2020-05-01 DIAGNOSIS — E7800 Pure hypercholesterolemia, unspecified: Secondary | ICD-10-CM

## 2020-05-01 DIAGNOSIS — R69 Illness, unspecified: Secondary | ICD-10-CM | POA: Diagnosis not present

## 2020-05-01 DIAGNOSIS — I10 Essential (primary) hypertension: Secondary | ICD-10-CM | POA: Diagnosis not present

## 2020-05-01 DIAGNOSIS — D75839 Thrombocytosis, unspecified: Secondary | ICD-10-CM | POA: Diagnosis not present

## 2020-05-01 DIAGNOSIS — K219 Gastro-esophageal reflux disease without esophagitis: Secondary | ICD-10-CM

## 2020-05-01 MED ORDER — AMLODIPINE BESYLATE 5 MG PO TABS: 5.0000 mg | ORAL_TABLET | Freq: Every day | ORAL | 1 refills | Status: DC

## 2020-05-01 MED ORDER — LISINOPRIL-HYDROCHLOROTHIAZIDE 20-12.5 MG PO TABS: 2.0000 | ORAL_TABLET | Freq: Every day | ORAL | 1 refills | Status: DC

## 2020-05-01 MED ORDER — LEVOTHYROXINE SODIUM 100 MCG PO TABS: 100.0000 ug | ORAL_TABLET | Freq: Every day | ORAL | 1 refills | Status: DC

## 2020-05-01 MED ORDER — ATORVASTATIN CALCIUM 40 MG PO TABS: 40.0000 mg | ORAL_TABLET | Freq: Every day | ORAL | 1 refills | Status: DC

## 2020-05-01 MED ORDER — OMEPRAZOLE 40 MG PO CPDR: DELAYED_RELEASE_CAPSULE | ORAL | 1 refills | Status: DC

## 2020-05-01 NOTE — Progress Notes (Signed)
Subjective:    Patient ID: Gabriela Gardner, female    DOB: Jun 02, 1951, 69 y.o.   MRN: [id number]   Chief Complaint: Medical Management of Chronic Issues    HPI:  1. Primary hypertension No c/o chest pain, sob or headaches. Does not check her blood pressure at Home. BP Readings from Last 3 Encounters:  05/01/20 (!) 142/80  10/28/19 116/66  07/09/18 129/78     2. Pure hypercholesterolemia Tries to watch diet and walks on treadmill 2-3 x a week. Lab Results  Component Value Date   CHOL 153 10/28/2019   HDL 49 10/28/2019   LDLCALC 72 10/28/2019   TRIG 191 (H) 10/28/2019   CHOLHDL 3.1 10/28/2019     3. Gastroesophageal reflux disease without esophagitis Is on omeprazole daily and denise any symptoms.  4. Acquired hypothyroidism No problems that she is aware of. Lab Results  Component Value Date   TSH 1.650 10/28/2019     5. Primary insomnia She uses nuisom nightly to sleep. She says that it works well for her. Feels rested  In mornings  6. Thrombocytosis Lab Results  Component Value Date   WBC 6.1 10/28/2019   HGB 13.9 10/28/2019   HCT 40.4 10/28/2019   MCV 94 10/28/2019   PLT 250 10/28/2019     7. BMI 28.0-28.9,adult No recent weight changes Wt Readings from Last 3 Encounters:  05/01/20 166 lb (75.3 kg)  10/28/19 168 lb 4 oz (76.3 kg)  01/22/18 170 lb (77.1 kg)   BMI Readings from Last 3 Encounters:  05/01/20 28.49 kg/m  10/28/19 28.88 kg/m  01/22/18 29.18 kg/m       Outpatient Encounter Medications as of 05/01/2020  Medication Sig  . amLODipine (NORVASC) 5 MG tablet Take 1 tablet (5 mg total) by mouth daily.  Marland Kitchen atorvastatin (LIPITOR) 40 MG tablet Take 1 tablet (40 mg total) by mouth daily.  . calcium carbonate (OS-CAL) 600 MG TABS Take 600 mg by mouth daily.  . ferrous sulfate 325 (65 FE) MG EC tablet Take 1 tablet (325 mg total) by mouth 2 (two) times daily before a meal.  . levothyroxine (SYNTHROID) 100 MCG tablet Take 1 tablet (100 mcg  total) by mouth daily.  Marland Kitchen lisinopril-hydrochlorothiazide (ZESTORETIC) 20-12.5 MG tablet Take 2 tablets by mouth daily.  . meloxicam (MOBIC) 15 MG tablet Take 1 tablet (15 mg total) by mouth daily.  . Multiple Vitamin (MULTIVITAMIN) tablet Take 1 tablet by mouth daily.  Marland Kitchen omeprazole (PRILOSEC) 40 MG capsule TAKE 1 CAPSULE DAILY     Past Surgical History:  Procedure Laterality Date  . ABDOMINAL HYSTERECTOMY    . SKIN LESION EXCISION      Family History  Adopted: Yes  Problem Relation Age of Onset  . Asthma Son   . Turner syndrome Daughter     New complaints: None today  Social history: Lives with her husband of 64 years  Controlled substance contract: n/a    Review of Systems  Constitutional: Negative for diaphoresis.  Eyes: Negative for pain.  Respiratory: Negative for shortness of breath.   Cardiovascular: Negative for chest pain, palpitations and leg swelling.  Gastrointestinal: Negative for abdominal pain.  Endocrine: Negative for polydipsia.  Skin: Negative for rash.  Neurological: Negative for dizziness, weakness and headaches.  Hematological: Does not bruise/bleed easily.  All other systems reviewed and are negative.      Objective:   Physical Exam Vitals and nursing note reviewed.  Constitutional:      General: She is  not in acute distress.    Appearance: Normal appearance. She is well-developed.  HENT:     Head: Normocephalic.     Nose: Nose normal.  Eyes:     Pupils: Pupils are equal, round, and reactive to light.  Neck:     Vascular: No carotid bruit or JVD.  Cardiovascular:     Rate and Rhythm: Normal rate and regular rhythm.     Heart sounds: Normal heart sounds.  Pulmonary:     Effort: Pulmonary effort is normal. No respiratory distress.     Breath sounds: Normal breath sounds. No wheezing or rales.  Chest:     Chest wall: No tenderness.  Abdominal:     General: Bowel sounds are normal. There is no distension or abdominal bruit.      Palpations: Abdomen is soft. There is no hepatomegaly, splenomegaly, mass or pulsatile mass.     Tenderness: There is no abdominal tenderness.  Musculoskeletal:        General: Normal range of motion.     Cervical back: Normal range of motion and neck supple.  Lymphadenopathy:     Cervical: No cervical adenopathy.  Skin:    General: Skin is warm and dry.  Neurological:     Mental Status: She is alert and oriented to person, place, and time.     Deep Tendon Reflexes: Reflexes are normal and symmetric.  Psychiatric:        Behavior: Behavior normal.        Thought Content: Thought content normal.        Judgment: Judgment normal.    BP (!) 142/80   Pulse 70   Temp 97.9 F (36.6 C) (Temporal)   Resp 20   Ht 5' 4"  (1.626 m)   Wt 166 lb (75.3 kg)   SpO2 98%   BMI 28.49 kg/m         Assessment & Plan:  Gabriela Gardner comes in today with chief complaint of Medical Management of Chronic Issues   Diagnosis and orders addressed:  1. Primary hypertension L- amLODipine (NORVASC) 5 MG tablet; Take 1 tablet (5 mg total) by mouth daily.  Dispense: 90 tablet; Refill: 1 - lisinopril-hydrochlorothiazide (ZESTORETIC) 20-12.5 MG tablet; Take 2 tablets by mouth daily.  Dispense: 180 tablet; Refill: 1 ow sodium diet   2. Pure hypercholesterolemia Low fat diet - atorvastatin (LIPITOR) 40 MG tablet; Take 1 tablet (40 mg total) by mouth daily.  Dispense: 90 tablet; Refill: 1  3. Gastroesophageal reflux disease without esophagitis Avoid spicy foods Do not eat 2 hours prior to bedtime - omeprazole (PRILOSEC) 40 MG capsule; TAKE 1 CAPSULE DAILY  Dispense: 90 capsule; Refill: 1  4. Acquired hypothyroidism Labs pedning - levothyroxine (SYNTHROID) 100 MCG tablet; Take 1 tablet (100 mcg total) by mouth daily.  Dispense: 90 tablet; Refill: 1  5. Primary insomnia Continue unisom OTC Bedtime routine  6. Thrombocytosis Labs pending  7. BMI 28.0-28.9,adult Discussed diet and exercise for  person with BMI >25 Will recheck weight in 3-6 months  Orders Placed This Encounter  Procedures  . CMP14+EGFR  . CBC with Differential/Platelet  . Lipid panel  . Thyroid Panel With TSH      Labs pending Health Maintenance reviewed Diet and exercise encouraged  Follow up plan: 6 months   [place], FNP

## 2020-05-01 NOTE — Patient Instructions (Signed)

## 2020-05-02 LAB — CBC WITH DIFFERENTIAL/PLATELET
Basophils Absolute: 0.1 10*3/uL (ref 0.0–0.2)
Basos: 1 %
EOS (ABSOLUTE): 0.2 10*3/uL (ref 0.0–0.4)
Eos: 4 %
Hematocrit: 41 % (ref 34.0–46.6)
Hemoglobin: 14.2 g/dL (ref 11.1–15.9)
Immature Grans (Abs): 0 10*3/uL (ref 0.0–0.1)
Immature Granulocytes: 0 %
Lymphocytes Absolute: 1.2 10*3/uL (ref 0.7–3.1)
Lymphs: 20 %
MCH: 32.3 pg (ref 26.6–33.0)
MCHC: 34.6 g/dL (ref 31.5–35.7)
MCV: 93 fL (ref 79–97)
Monocytes Absolute: 0.5 10*3/uL (ref 0.1–0.9)
Monocytes: 8 %
Neutrophils Absolute: 4 10*3/uL (ref 1.4–7.0)
Neutrophils: 67 %
Platelets: 221 10*3/uL (ref 150–450)
RBC: 4.39 x10E6/uL (ref 3.77–5.28)
RDW: 11.8 % (ref 11.7–15.4)
WBC: 6 10*3/uL (ref 3.4–10.8)

## 2020-05-02 LAB — LIPID PANEL
Chol/HDL Ratio: 3.4 ratio (ref 0.0–4.4)
Cholesterol, Total: 169 mg/dL (ref 100–199)
HDL: 49 mg/dL (ref >39)
LDL Chol Calc (NIH): 80 mg/dL (ref 0–99)
Triglycerides: 240 mg/dL — ABNORMAL HIGH (ref 0–149)
VLDL Cholesterol Cal: 40 mg/dL (ref 5–40)

## 2020-05-02 LAB — CMP14+EGFR
ALT: 67 IU/L — ABNORMAL HIGH (ref 0–32)
AST: 41 IU/L — ABNORMAL HIGH (ref 0–40)
Albumin/Globulin Ratio: 2 (ref 1.2–2.2)
Albumin: 4.7 g/dL (ref 3.8–4.8)
Alkaline Phosphatase: 74 IU/L (ref 44–121)
BUN/Creatinine Ratio: 14 (ref 12–28)
BUN: 13 mg/dL (ref 8–27)
Bilirubin Total: 0.5 mg/dL (ref 0.0–1.2)
CO2: 25 mmol/L (ref 20–29)
Calcium: 9.6 mg/dL (ref 8.7–10.3)
Chloride: 100 mmol/L (ref 96–106)
Creatinine, Ser: 0.94 mg/dL (ref 0.57–1.00)
GFR calc Af Amer: 71 mL/min/{1.73_m2} (ref >59)
GFR calc non Af Amer: 62 mL/min/{1.73_m2} (ref >59)
Globulin, Total: 2.4 g/dL (ref 1.5–4.5)
Glucose: 115 mg/dL — ABNORMAL HIGH (ref 65–99)
Potassium: 4.2 mmol/L (ref 3.5–5.2)
Sodium: 140 mmol/L (ref 134–144)
Total Protein: 7.1 g/dL (ref 6.0–8.5)

## 2020-05-02 LAB — THYROID PANEL WITH TSH
Free Thyroxine Index: 1.9 (ref 1.2–4.9)
T3 Uptake Ratio: 24 % (ref 24–39)
T4, Total: 7.9 ug/dL (ref 4.5–12.0)
TSH: 1.56 u[IU]/mL (ref 0.450–4.500)

## 2020-07-11 ENCOUNTER — Other Ambulatory Visit: Payer: Self-pay | Admitting: *Deleted

## 2020-07-11 DIAGNOSIS — M1990 Unspecified osteoarthritis, unspecified site: Secondary | ICD-10-CM

## 2020-07-12 MED ORDER — MELOXICAM 15 MG PO TABS: 15.0000 mg | ORAL_TABLET | Freq: Every day | ORAL | 0 refills | Status: DC

## 2020-08-21 ENCOUNTER — Other Ambulatory Visit: Payer: Self-pay | Admitting: Nurse Practitioner

## 2020-08-21 DIAGNOSIS — M1990 Unspecified osteoarthritis, unspecified site: Secondary | ICD-10-CM

## 2020-09-04 DIAGNOSIS — H25813 Combined forms of age-related cataract, bilateral: Secondary | ICD-10-CM | POA: Diagnosis not present

## 2020-09-04 DIAGNOSIS — H02834 Dermatochalasis of left upper eyelid: Secondary | ICD-10-CM | POA: Diagnosis not present

## 2020-09-04 DIAGNOSIS — H02831 Dermatochalasis of right upper eyelid: Secondary | ICD-10-CM | POA: Diagnosis not present

## 2020-09-04 DIAGNOSIS — H527 Unspecified disorder of refraction: Secondary | ICD-10-CM | POA: Diagnosis not present

## 2020-09-04 DIAGNOSIS — H43813 Vitreous degeneration, bilateral: Secondary | ICD-10-CM | POA: Diagnosis not present

## 2020-10-25 ENCOUNTER — Telehealth: Payer: Self-pay

## 2020-10-29 ENCOUNTER — Encounter: Payer: Self-pay | Admitting: Nurse Practitioner

## 2020-10-29 ENCOUNTER — Other Ambulatory Visit: Payer: Self-pay

## 2020-10-29 ENCOUNTER — Ambulatory Visit (INDEPENDENT_AMBULATORY_CARE_PROVIDER_SITE_OTHER): Payer: Medicare HMO

## 2020-10-29 ENCOUNTER — Ambulatory Visit (INDEPENDENT_AMBULATORY_CARE_PROVIDER_SITE_OTHER): Payer: Medicare HMO | Admitting: Nurse Practitioner

## 2020-10-29 VITALS — BP 131/71 | HR 62 | Temp 97.5°F | Resp 20 | Ht 64.0 in | Wt 167.0 lb

## 2020-10-29 DIAGNOSIS — I10 Essential (primary) hypertension: Secondary | ICD-10-CM

## 2020-10-29 DIAGNOSIS — Z6828 Body mass index (BMI) 28.0-28.9, adult: Secondary | ICD-10-CM | POA: Diagnosis not present

## 2020-10-29 DIAGNOSIS — M85852 Other specified disorders of bone density and structure, left thigh: Secondary | ICD-10-CM | POA: Diagnosis not present

## 2020-10-29 DIAGNOSIS — E7800 Pure hypercholesterolemia, unspecified: Secondary | ICD-10-CM

## 2020-10-29 DIAGNOSIS — F5101 Primary insomnia: Secondary | ICD-10-CM

## 2020-10-29 DIAGNOSIS — R69 Illness, unspecified: Secondary | ICD-10-CM | POA: Diagnosis not present

## 2020-10-29 DIAGNOSIS — Z780 Asymptomatic menopausal state: Secondary | ICD-10-CM | POA: Diagnosis not present

## 2020-10-29 DIAGNOSIS — E039 Hypothyroidism, unspecified: Secondary | ICD-10-CM

## 2020-10-29 DIAGNOSIS — K219 Gastro-esophageal reflux disease without esophagitis: Secondary | ICD-10-CM

## 2020-10-29 MED ORDER — LEVOTHYROXINE SODIUM 100 MCG PO TABS: 100.0000 ug | ORAL_TABLET | Freq: Every day | ORAL | 1 refills | Status: DC

## 2020-10-29 MED ORDER — OMEPRAZOLE 40 MG PO CPDR: DELAYED_RELEASE_CAPSULE | ORAL | 1 refills | Status: DC

## 2020-10-29 MED ORDER — ATORVASTATIN CALCIUM 40 MG PO TABS: 40.0000 mg | ORAL_TABLET | Freq: Every day | ORAL | 1 refills | Status: DC

## 2020-10-29 MED ORDER — AMLODIPINE BESYLATE 5 MG PO TABS: 5.0000 mg | ORAL_TABLET | Freq: Every day | ORAL | 1 refills | Status: DC

## 2020-10-29 MED ORDER — LISINOPRIL-HYDROCHLOROTHIAZIDE 20-12.5 MG PO TABS: 2.0000 | ORAL_TABLET | Freq: Every day | ORAL | 1 refills | Status: DC

## 2020-10-29 NOTE — Progress Notes (Signed)
Subjective:    Patient ID: Gabriela Gardner, female    DOB: 1951-06-20, 70 y.o.   MRN: [id number]   Chief Complaint: Medical Management of Chronic Issues    HPI:  1. Primary hypertension No c/o chest pain, sob or headache. Doe snot check blood pressure at h ome. BP Readings from Last 3 Encounters:  10/29/20 131/71  05/01/20 (!) 142/80  10/28/19 116/66     2. Pure hypercholesterolemia She does try to wtahc diet. She does not dedicated exercise, but says she stays very active. Lab Results  Component Value Date   CHOL 169 05/01/2020   HDL 49 05/01/2020   LDLCALC 80 05/01/2020   TRIG 240 (H) 05/01/2020   CHOLHDL 3.4 05/01/2020     3. Gastroesophageal reflux disease without esophagitis Is on omeprazole daily and is doing well.  4. Acquired hypothyroidism No problems that she is aware of Lab Results  Component Value Date   TSH 1.560 05/01/2020     5. Primary insomnia She sleeps well as log as she takes a unisom  6. BMI 28.0-28.9,adult No recent weight changes Wt Readings from Last 3 Encounters:  10/29/20 167 lb (75.8 kg)  05/01/20 166 lb (75.3 kg)  10/28/19 168 lb 4 oz (76.3 kg)   BMI Readings from Last 3 Encounters:  10/29/20 28.67 kg/m  05/01/20 28.49 kg/m  10/28/19 28.88 kg/m       Outpatient Encounter Medications as of 10/29/2020  Medication Sig  . amLODipine (NORVASC) 5 MG tablet Take 1 tablet (5 mg total) by mouth daily.  Marland Kitchen atorvastatin (LIPITOR) 40 MG tablet Take 1 tablet (40 mg total) by mouth daily.  . calcium carbonate (OS-CAL) 600 MG TABS Take 600 mg by mouth daily.  . ferrous sulfate 325 (65 FE) MG EC tablet Take 1 tablet (325 mg total) by mouth 2 (two) times daily before a meal.  . levothyroxine (SYNTHROID) 100 MCG tablet Take 1 tablet (100 mcg total) by mouth daily.  Marland Kitchen lisinopril-hydrochlorothiazide (ZESTORETIC) 20-12.5 MG tablet Take 2 tablets by mouth daily.  . meloxicam (MOBIC) 15 MG tablet TAKE 1 TABLET DAILY  . Multiple Vitamin  (MULTIVITAMIN) tablet Take 1 tablet by mouth daily.  Marland Kitchen omeprazole (PRILOSEC) 40 MG capsule TAKE 1 CAPSULE DAILY   No facility-administered encounter medications on file as of 10/29/2020.    Past Surgical History:  Procedure Laterality Date  . ABDOMINAL HYSTERECTOMY    . SKIN LESION EXCISION      Family History  Adopted: Yes  Problem Relation Age of Onset  . Asthma Son   . Turner syndrome Daughter     New complaints: Nothing other then arthritis in her hands. Worse onleft then right  Social history: Lives with her hubby  Controlled substance contract: n/a    Review of Systems  Constitutional: Negative for diaphoresis.  Eyes: Negative for pain.  Respiratory: Negative for shortness of breath.   Cardiovascular: Negative for chest pain, palpitations and leg swelling.  Gastrointestinal: Negative for abdominal pain.  Endocrine: Negative for polydipsia.  Skin: Negative for rash.  Neurological: Negative for dizziness, weakness and headaches.  Hematological: Does not bruise/bleed easily.  All other systems reviewed and are negative.      Objective:   Physical Exam Vitals and nursing note reviewed.  Constitutional:      General: She is not in acute distress.    Appearance: Normal appearance. She is well-developed.  HENT:     Head: Normocephalic.     Nose: Nose normal.  Eyes:  Pupils: Pupils are equal, round, and reactive to light.  Neck:     Vascular: No carotid bruit or JVD.  Cardiovascular:     Rate and Rhythm: Normal rate and regular rhythm.     Heart sounds: Normal heart sounds.  Pulmonary:     Effort: Pulmonary effort is normal. No respiratory distress.     Breath sounds: Normal breath sounds. No wheezing or rales.  Chest:     Chest wall: No tenderness.  Abdominal:     General: Bowel sounds are normal. There is no distension or abdominal bruit.     Palpations: Abdomen is soft. There is no hepatomegaly, splenomegaly, mass or pulsatile mass.     Tenderness:  There is no abdominal tenderness.  Musculoskeletal:        General: Normal range of motion.     Cervical back: Normal range of motion and neck supple.  Lymphadenopathy:     Cervical: No cervical adenopathy.  Skin:    General: Skin is warm and dry.  Neurological:     Mental Status: She is alert and oriented to person, place, and time.     Deep Tendon Reflexes: Reflexes are normal and symmetric.  Psychiatric:        Behavior: Behavior normal.        Thought Content: Thought content normal.        Judgment: Judgment normal.    BP 131/71   Pulse 62   Temp (!) 97.5 F (36.4 C) (Temporal)   Resp 20   Ht 5' 4"  (1.626 m)   Wt 167 lb (75.8 kg)   SpO2 99%   BMI 28.67 kg/m         Assessment & Plan:  Gabriela Gardner comes in today with chief complaint of Medical Management of Chronic Issues   Diagnosis and orders addressed:  1. Primary hypertension Low sodium diet - amLODipine (NORVASC) 5 MG tablet; Take 1 tablet (5 mg total) by mouth daily.  Dispense: 90 tablet; Refill: 1 - lisinopril-hydrochlorothiazide (ZESTORETIC) 20-12.5 MG tablet; Take 2 tablets by mouth daily.  Dispense: 180 tablet; Refill: 1 - CBC with Differential/Platelet - CMP14+EGFR  2. Pure hypercholesterolemia Facility age limit for growth percentiles is 20 years. fat diet - atorvastatin (LIPITOR) 40 MG tablet; Take 1 tablet (40 mg total) by mouth daily.  Dispense: 90 tablet; Refill: 1 - Lipid panel  3. Gastroesophageal reflux disease without esophagitis Avoid spicy foods Do not eat 2 hours prior to bedtime - omeprazole (PRILOSEC) 40 MG capsule; TAKE 1 CAPSULE DAILY  Dispense: 90 capsule; Refill: 1  4. Acquired hypothyroidism Labs oending - levothyroxine (SYNTHROID) 100 MCG tablet; Take 1 tablet (100 mcg total) by mouth daily.  Dispense: 90 tablet; Refill: 1 - Thyroid Panel With TSH  5. Primary insomnia Bedtime routine  6. BMI 28.0-28.9,adult Discussed diet and exercise for person with BMI >25 Will  recheck weight in 3-6 months   Labs pending Health Maintenance reviewed Diet and exercise encouraged  Follow up plan: 6 months   Mary-Margaret Hassell Done, FNP

## 2020-10-29 NOTE — Patient Instructions (Signed)
Exercising to Stay Healthy To become healthy and stay healthy, it is recommended that you do moderate-intensity and vigorous-intensity exercise. You can tell that you are exercising at a moderate intensity if your heart starts beating faster and you start breathing faster but can still hold a conversation. You can tell that you are exercising at a vigorous intensity if you are breathing much harder and faster and cannot hold a conversation while exercising. Exercising regularly is important. It has many health benefits, such as:  Improving overall fitness, flexibility, and endurance.  Increasing bone density.  Helping with weight control.  Decreasing body fat.  Increasing muscle strength.  Reducing stress and tension.  Improving overall health. How often should I exercise? Choose an activity that you enjoy, and set realistic goals. Your health care provider can help you make an activity plan that works for you. Exercise regularly as told by your health care provider. This may include:  Doing strength training two times a week, such as: ? Lifting weights. ? Using resistance bands. ? Push-ups. ? Sit-ups. ? Yoga.  Doing a certain intensity of exercise for a given amount of time. Choose from these options: ? A total of 150 minutes of moderate-intensity exercise every week. ? A total of 75 minutes of vigorous-intensity exercise every week. ? A mix of moderate-intensity and vigorous-intensity exercise every week. Children, pregnant women, people who have not exercised regularly, people who are overweight, and older adults may need to talk with a health care provider about what activities are safe to do. If you have a medical condition, be sure to talk with your health care provider before you start a new exercise program. What are some exercise ideas? Moderate-intensity exercise ideas include:  Walking 1 mile (1.6 km) in about 15  minutes.  Biking.  Hiking.  Golfing.  Dancing.  Water aerobics. Vigorous-intensity exercise ideas include:  Walking 4.5 miles (7.2 km) or more in about 1 hour.  Jogging or running 5 miles (8 km) in about 1 hour.  Biking 10 miles (16.1 km) or more in about 1 hour.  Lap swimming.  Roller-skating or in-line skating.  Cross-country skiing.  Vigorous competitive sports, such as football, basketball, and soccer.  Jumping rope.  Aerobic dancing.   What are some everyday activities that can help me to get exercise?  Yard work, such as: ? Pushing a lawn mower. ? Raking and bagging leaves.  Washing your car.  Pushing a stroller.  Shoveling snow.  Gardening.  Washing windows or floors. How can I be more active in my day-to-day activities?  Use stairs instead of an elevator.  Take a walk during your lunch break.  If you drive, park your car farther away from your work or school.  If you take public transportation, get off one stop early and walk the rest of the way.  Stand up or walk around during all of your indoor phone calls.  Get up, stretch, and walk around every 30 minutes throughout the day.  Enjoy exercise with a friend. Support to continue exercising will help you keep a regular routine of activity. What guidelines can I follow while exercising?  Before you start a new exercise program, talk with your health care provider.  Do not exercise so much that you hurt yourself, feel dizzy, or get very short of breath.  Wear comfortable clothes and wear shoes with good support.  Drink plenty of water while you exercise to prevent dehydration or heat stroke.  Work out until   your breathing and your heartbeat get faster. Where to find more information  [place] Department of Health and Human Services: www.hhs.gov  Centers for Disease Control and Prevention (CDC): www.cdc.gov Summary  Exercising regularly is important. It will improve your overall fitness,  flexibility, and endurance.  Regular exercise also will improve your overall health. It can help you control your weight, reduce stress, and improve your bone density.  Do not exercise so much that you hurt yourself, feel dizzy, or get very short of breath.  Before you start a new exercise program, talk with your health care provider. This information is not intended to replace advice given to you by your health care provider. Make sure you discuss any questions you have with your health care provider. Document Revised: 05/29/2017 Document Reviewed: 05/07/2017 Elsevier Patient Education  2021 [employer]  

## 2020-10-30 LAB — CMP14+EGFR
ALT: 65 IU/L — ABNORMAL HIGH (ref 0–32)
AST: 47 IU/L — ABNORMAL HIGH (ref 0–40)
Albumin/Globulin Ratio: 2 (ref 1.2–2.2)
Albumin: 4.9 g/dL — ABNORMAL HIGH (ref 3.8–4.8)
Alkaline Phosphatase: 76 IU/L (ref 44–121)
BUN/Creatinine Ratio: 17 (ref 12–28)
BUN: 17 mg/dL (ref 8–27)
Bilirubin Total: 0.6 mg/dL (ref 0.0–1.2)
CO2: 24 mmol/L (ref 20–29)
Calcium: 10.1 mg/dL (ref 8.7–10.3)
Chloride: 102 mmol/L (ref 96–106)
Creatinine, Ser: 1 mg/dL (ref 0.57–1.00)
Globulin, Total: 2.5 g/dL (ref 1.5–4.5)
Glucose: 114 mg/dL — ABNORMAL HIGH (ref 65–99)
Potassium: 4.6 mmol/L (ref 3.5–5.2)
Sodium: 142 mmol/L (ref 134–144)
Total Protein: 7.4 g/dL (ref 6.0–8.5)
eGFR: 61 mL/min/{1.73_m2} (ref >59)

## 2020-10-30 LAB — CBC WITH DIFFERENTIAL/PLATELET
Basophils Absolute: 0.1 10*3/uL (ref 0.0–0.2)
Basos: 1 %
EOS (ABSOLUTE): 0.2 10*3/uL (ref 0.0–0.4)
Eos: 3 %
Hematocrit: 40.6 % (ref 34.0–46.6)
Hemoglobin: 13.9 g/dL (ref 11.1–15.9)
Immature Grans (Abs): 0 10*3/uL (ref 0.0–0.1)
Immature Granulocytes: 0 %
Lymphocytes Absolute: 1.1 10*3/uL (ref 0.7–3.1)
Lymphs: 19 %
MCH: 32.5 pg (ref 26.6–33.0)
MCHC: 34.2 g/dL (ref 31.5–35.7)
MCV: 95 fL (ref 79–97)
Monocytes Absolute: 0.5 10*3/uL (ref 0.1–0.9)
Monocytes: 8 %
Neutrophils Absolute: 4 10*3/uL (ref 1.4–7.0)
Neutrophils: 69 %
Platelets: 232 10*3/uL (ref 150–450)
RBC: 4.28 x10E6/uL (ref 3.77–5.28)
RDW: 12.7 % (ref 11.7–15.4)
WBC: 5.9 10*3/uL (ref 3.4–10.8)

## 2020-10-30 LAB — THYROID PANEL WITH TSH
Free Thyroxine Index: 1.9 (ref 1.2–4.9)
T3 Uptake Ratio: 24 % (ref 24–39)
T4, Total: 7.9 ug/dL (ref 4.5–12.0)
TSH: 1.82 u[IU]/mL (ref 0.450–4.500)

## 2020-10-30 LAB — LIPID PANEL
Chol/HDL Ratio: 3.6 ratio (ref 0.0–4.4)
Cholesterol, Total: 185 mg/dL (ref 100–199)
HDL: 52 mg/dL (ref >39)
LDL Chol Calc (NIH): 90 mg/dL (ref 0–99)
Triglycerides: 259 mg/dL — ABNORMAL HIGH (ref 0–149)
VLDL Cholesterol Cal: 43 mg/dL — ABNORMAL HIGH (ref 5–40)

## 2020-11-12 ENCOUNTER — Other Ambulatory Visit: Payer: Self-pay | Admitting: Nurse Practitioner

## 2020-11-12 DIAGNOSIS — E039 Hypothyroidism, unspecified: Secondary | ICD-10-CM

## 2020-11-15 DIAGNOSIS — H52209 Unspecified astigmatism, unspecified eye: Secondary | ICD-10-CM | POA: Diagnosis not present

## 2020-11-15 DIAGNOSIS — H5213 Myopia, bilateral: Secondary | ICD-10-CM | POA: Diagnosis not present

## 2020-11-23 ENCOUNTER — Other Ambulatory Visit: Payer: Self-pay | Admitting: Nurse Practitioner

## 2020-11-23 DIAGNOSIS — E039 Hypothyroidism, unspecified: Secondary | ICD-10-CM

## 2020-12-06 ENCOUNTER — Other Ambulatory Visit: Payer: Self-pay

## 2020-12-06 ENCOUNTER — Telehealth: Payer: Self-pay | Admitting: Nurse Practitioner

## 2020-12-06 DIAGNOSIS — E039 Hypothyroidism, unspecified: Secondary | ICD-10-CM

## 2020-12-06 MED ORDER — LEVOTHYROXINE SODIUM 100 MCG PO TABS: 100.0000 ug | ORAL_TABLET | Freq: Every day | ORAL | 1 refills | Status: DC

## 2020-12-06 NOTE — Telephone Encounter (Signed)
Contacted [facility] and they stated that they never received the rx. Advised that it was sent on 5/2 and 5/27 and per the computer it was received by the pharmacy. Advised [facility] that I would resend to them and advised patient that it was sent in again. Patient verbalized understanding. Advised patient if she did not receive medication soon to contact our office.

## 2020-12-06 NOTE — Telephone Encounter (Signed)
Pt called stating that she received a letter/email from her mail order pharmacy that she needed to contact her PCP office regarding her Levothyroxine Rx because they reached out to [place] recently about refilling and we did not respond.  Tried explaining to pt that we send in refills for her on 10/29/20.. Pt says she has not received any of her medicine.  Please call pharmacy to see what is going on and call pt with update.

## 2020-12-19 ENCOUNTER — Other Ambulatory Visit: Payer: Self-pay | Admitting: Nurse Practitioner

## 2020-12-19 DIAGNOSIS — Z1231 Encounter for screening mammogram for malignant neoplasm of breast: Secondary | ICD-10-CM

## 2020-12-31 ENCOUNTER — Other Ambulatory Visit: Payer: Self-pay | Admitting: Nurse Practitioner

## 2020-12-31 DIAGNOSIS — M1990 Unspecified osteoarthritis, unspecified site: Secondary | ICD-10-CM

## 2021-01-09 DIAGNOSIS — D2271 Melanocytic nevi of right lower limb, including hip: Secondary | ICD-10-CM | POA: Diagnosis not present

## 2021-01-09 DIAGNOSIS — L820 Inflamed seborrheic keratosis: Secondary | ICD-10-CM | POA: Diagnosis not present

## 2021-01-09 DIAGNOSIS — L723 Sebaceous cyst: Secondary | ICD-10-CM | POA: Diagnosis not present

## 2021-01-09 DIAGNOSIS — Z85820 Personal history of malignant melanoma of skin: Secondary | ICD-10-CM | POA: Diagnosis not present

## 2021-01-09 DIAGNOSIS — D2262 Melanocytic nevi of left upper limb, including shoulder: Secondary | ICD-10-CM | POA: Diagnosis not present

## 2021-01-09 DIAGNOSIS — D2272 Melanocytic nevi of left lower limb, including hip: Secondary | ICD-10-CM | POA: Diagnosis not present

## 2021-01-09 DIAGNOSIS — D225 Melanocytic nevi of trunk: Secondary | ICD-10-CM | POA: Diagnosis not present

## 2021-01-09 DIAGNOSIS — D2261 Melanocytic nevi of right upper limb, including shoulder: Secondary | ICD-10-CM | POA: Diagnosis not present

## 2021-01-09 DIAGNOSIS — L821 Other seborrheic keratosis: Secondary | ICD-10-CM | POA: Diagnosis not present

## 2021-01-09 DIAGNOSIS — D3611 Benign neoplasm of peripheral nerves and autonomic nervous system of face, head, and neck: Secondary | ICD-10-CM | POA: Diagnosis not present

## 2021-02-11 ENCOUNTER — Other Ambulatory Visit: Payer: Self-pay | Admitting: Nurse Practitioner

## 2021-02-11 DIAGNOSIS — M1990 Unspecified osteoarthritis, unspecified site: Secondary | ICD-10-CM

## 2021-02-27 ENCOUNTER — Ambulatory Visit
Admission: RE | Admit: 2021-02-27 | Discharge: 2021-02-27 | Disposition: A | Discharge status: Home / Self Care | Payer: Medicare HMO | Source: Ambulatory Visit | Attending: Nurse Practitioner | Admitting: Nurse Practitioner

## 2021-02-27 ENCOUNTER — Other Ambulatory Visit: Payer: Self-pay

## 2021-02-27 DIAGNOSIS — Z1231 Encounter for screening mammogram for malignant neoplasm of breast: Secondary | ICD-10-CM | POA: Diagnosis not present

## 2021-03-05 ENCOUNTER — Other Ambulatory Visit: Payer: Self-pay | Admitting: Nurse Practitioner

## 2021-03-05 DIAGNOSIS — R928 Other abnormal and inconclusive findings on diagnostic imaging of breast: Secondary | ICD-10-CM

## 2021-03-05 NOTE — Progress Notes (Signed)
Patient has appt 03/16/21

## 2021-03-06 ENCOUNTER — Telehealth: Payer: Self-pay | Admitting: Nurse Practitioner

## 2021-03-06 NOTE — Telephone Encounter (Signed)
Left message for patient to call back and schedule Medicare Annual Wellness Visit (AWV) either virtually or in office. I left both office and my number [phone]  Last AWVI 09/16/19  please schedule at anytime with health coach  This should be a 45 minute visit.

## 2021-03-11 ENCOUNTER — Ambulatory Visit (INDEPENDENT_AMBULATORY_CARE_PROVIDER_SITE_OTHER): Payer: Medicare HMO

## 2021-03-11 VITALS — Ht 64.0 in | Wt 159.0 lb

## 2021-03-11 DIAGNOSIS — Z0000 Encounter for general adult medical examination without abnormal findings: Secondary | ICD-10-CM

## 2021-03-11 NOTE — Patient Instructions (Signed)
Gabriela Gardner , Thank you for taking time to come for your Medicare Wellness Visit. I appreciate your ongoing commitment to your health goals. Please review the following plan we discussed and let me know if I can assist you in the future.   Screening recommendations/referrals: Colonoscopy: Done 01/18/2013 - Repeat in 10 years Mammogram: Done 02/27/2021 - Repeat diagnostic + ultrasound scheduled 9/17@ 11:30 Bone Density: Done 10/29/2020 - Repeat every 2 years Recommended yearly ophthalmology/optometry visit for glaucoma screening and checkup Recommended yearly dental visit for hygiene and checkup  Vaccinations: Influenza vaccine: Done 03/29/2020 - Repeat annually Pneumococcal vaccine: Done 12/27/2014 & 09/19/2016 Tdap vaccine: Due. Recommend every 10 years Shingles vaccine: Zostavax done 2015 - due for Shingrix discussed. Please contact your pharmacy for coverage information.     Covid-19: Done 07/23/19, 08/20/19, 05/03/20, & 01/28/2021  Advanced directives: Advance directive discussed with you today. Even though you declined this today, please call our office should you change your mind, and we can give you the proper paperwork for you to fill out.   Conditions/risks identified: Keep up the great work!   Next appointment: Follow up in one year for your annual wellness visit    Preventive Care 65 Years and Older, Female Preventive care refers to lifestyle choices and visits with your health care provider that can promote health and wellness. What does preventive care include? A yearly physical exam. This is also called an annual well check. Dental exams once or twice a year. Routine eye exams. Ask your health care provider how often you should have your eyes checked. Personal lifestyle choices, including: Daily care of your teeth and gums. Regular physical activity. Eating a healthy diet. Avoiding tobacco and drug use. Limiting alcohol use. Practicing safe sex. Taking low-dose aspirin every  day. Taking vitamin and mineral supplements as recommended by your health care provider. What happens during an annual well check? The services and screenings done by your health care provider during your annual well check will depend on your age, overall health, lifestyle risk factors, and family history of disease. Counseling  Your health care provider may ask you questions about your: Alcohol use. Tobacco use. Drug use. Emotional well-being. Home and relationship well-being. Sexual activity. Eating habits. History of falls. Memory and ability to understand (cognition). Work and work Statistician. Reproductive health. Screening  You may have the following tests or measurements: Height, weight, and BMI. Blood pressure. Lipid and cholesterol levels. These may be checked every 5 years, or more frequently if you are over 72 years old. Skin check. Lung cancer screening. You may have this screening every year starting at age 68 if you have a 30-pack-year history of smoking and currently smoke or have quit within the past 15 years. Fecal occult blood test (FOBT) of the stool. You may have this test every year starting at age 1. Flexible sigmoidoscopy or colonoscopy. You may have a sigmoidoscopy every 5 years or a colonoscopy every 10 years starting at age 11. Hepatitis C blood test. Hepatitis B blood test. Sexually transmitted disease (STD) testing. Diabetes screening. This is done by checking your blood sugar (glucose) after you have not eaten for a while (fasting). You may have this done every 1-3 years. Bone density scan. This is done to screen for osteoporosis. You may have this done starting at age 82. Mammogram. This may be done every 1-2 years. Talk to your health care provider about how often you should have regular mammograms. Talk with your health care provider about  your test results, treatment options, and if necessary, the need for more tests. Vaccines  Your health care  provider may recommend certain vaccines, such as: Influenza vaccine. This is recommended every year. Tetanus, diphtheria, and acellular pertussis (Tdap, Td) vaccine. You may need a Td booster every 10 years. Zoster vaccine. You may need this after age 20. Pneumococcal 13-valent conjugate (PCV13) vaccine. One dose is recommended after age 15. Pneumococcal polysaccharide (PPSV23) vaccine. One dose is recommended after age 25. Talk to your health care provider about which screenings and vaccines you need and how often you need them. This information is not intended to replace advice given to you by your health care provider. Make sure you discuss any questions you have with your health care provider. Document Released: 07/13/2015 Document Revised: 03/05/2016 Document Reviewed: 04/17/2015 Elsevier Interactive Patient Education  2017 [place] Prevention in the Home Falls can cause injuries. They can happen to people of all ages. There are many things you can do to make your home safe and to help prevent falls. What can I do on the outside of my home? Regularly fix the edges of walkways and driveways and fix any cracks. Remove anything that might make you trip as you walk through a door, such as a raised step or threshold. Trim any bushes or trees on the path to your home. Use bright outdoor lighting. Clear any walking paths of anything that might make someone trip, such as rocks or tools. Regularly check to see if handrails are loose or broken. Make sure that both sides of any steps have handrails. Any raised decks and porches should have guardrails on the edges. Have any leaves, snow, or ice cleared regularly. Use sand or salt on walking paths during winter. Clean up any spills in your garage right away. This includes oil or grease spills. What can I do in the bathroom? Use night lights. Install grab bars by the toilet and in the tub and shower. Do not use towel bars as grab  bars. Use non-skid mats or decals in the tub or shower. If you need to sit down in the shower, use a plastic, non-slip stool. Keep the floor dry. Clean up any water that spills on the floor as soon as it happens. Remove soap buildup in the tub or shower regularly. Attach bath mats securely with double-sided non-slip rug tape. Do not have throw rugs and other things on the floor that can make you trip. What can I do in the bedroom? Use night lights. Make sure that you have a light by your bed that is easy to reach. Do not use any sheets or blankets that are too big for your bed. They should not hang down onto the floor. Have a firm chair that has side arms. You can use this for support while you get dressed. Do not have throw rugs and other things on the floor that can make you trip. What can I do in the kitchen? Clean up any spills right away. Avoid walking on wet floors. Keep items that you use a lot in easy-to-reach places. If you need to reach something above you, use a strong step stool that has a grab bar. Keep electrical cords out of the way. Do not use floor polish or wax that makes floors slippery. If you must use wax, use non-skid floor wax. Do not have throw rugs and other things on the floor that can make you trip. What can I do with  my stairs? Do not leave any items on the stairs. Make sure that there are handrails on both sides of the stairs and use them. Fix handrails that are broken or loose. Make sure that handrails are as long as the stairways. Check any carpeting to make sure that it is firmly attached to the stairs. Fix any carpet that is loose or worn. Avoid having throw rugs at the top or bottom of the stairs. If you do have throw rugs, attach them to the floor with carpet tape. Make sure that you have a light switch at the top of the stairs and the bottom of the stairs. If you do not have them, ask someone to add them for you. What else can I do to help prevent  falls? Wear shoes that: Do not have high heels. Have rubber bottoms. Are comfortable and fit you well. Are closed at the toe. Do not wear sandals. If you use a stepladder: Make sure that it is fully opened. Do not climb a closed stepladder. Make sure that both sides of the stepladder are locked into place. Ask someone to hold it for you, if possible. Clearly mark and make sure that you can see: Any grab bars or handrails. First and last steps. Where the edge of each step is. Use tools that help you move around (mobility aids) if they are needed. These include: Canes. Walkers. Scooters. Crutches. Turn on the lights when you go into a dark area. Replace any light bulbs as soon as they burn out. Set up your furniture so you have a clear path. Avoid moving your furniture around. If any of your floors are uneven, fix them. If there are any pets around you, be aware of where they are. Review your medicines with your doctor. Some medicines can make you feel dizzy. This can increase your chance of falling. Ask your doctor what other things that you can do to help prevent falls. This information is not intended to replace advice given to you by your health care provider. Make sure you discuss any questions you have with your health care provider. Document Released: 04/12/2009 Document Revised: 11/22/2015 Document Reviewed: 07/21/2014 Elsevier Interactive Patient Education  2017 [employer].

## 2021-03-11 NOTE — Progress Notes (Signed)
Subjective:   Gabriela Gardner is a 70 y.o. female who presents for Medicare Annual (Subsequent) preventive examination.  Virtual Visit via Telephone Note  I connected with  Gabriela Gardner on 03/11/21 at  2:00 PM EDT by telephone and verified that I am speaking with the correct person using two identifiers.  Location: Patient: Home Provider: [facility] Persons participating in the virtual visit: patient/Nurse Health Advisor   I discussed the limitations, risks, security and privacy concerns of performing an evaluation and management service by telephone and the availability of in person appointments. The patient expressed understanding and agreed to proceed.  Interactive audio and video telecommunications were attempted between this nurse and patient, however failed, due to patient having technical difficulties OR patient did not have access to video capability.  We continued and completed visit with audio only.  Some vital signs may be absent or patient reported.   Gabriela Gardner E Attila Mccarthy, LPN   Review of Systems     Cardiac Risk Factors include: advanced age (>31mn, >>23women);dyslipidemia;hypertension     Objective:    Today's Vitals   03/11/21 1410  Weight: 159 lb (72.1 kg)  Height: '5\' 4"'$  (1.626 m)   Body mass index is 27.29 kg/m.  Advanced Directives 03/11/2021 09/16/2019 11/11/2017 09/29/2017  Does Patient Have a Medical Advance Directive? No No No No  Would patient like information on creating a medical advance directive? No - Patient declined No - Patient declined No - Patient declined No - Patient declined    Current Medications (verified) Outpatient Encounter Medications as of 03/11/2021  Medication Sig   amLODipine (NORVASC) 5 MG tablet Take 1 tablet (5 mg total) by mouth daily.   atorvastatin (LIPITOR) 40 MG tablet Take 1 tablet (40 mg total) by mouth daily.   calcium carbonate (OS-CAL) 600 MG TABS Take 600 mg by mouth daily.   ferrous sulfate 325 (65 FE) MG EC tablet Take 1 tablet  (325 mg total) by mouth 2 (two) times daily before a meal.   levothyroxine (SYNTHROID) 100 MCG tablet Take 1 tablet (100 mcg total) by mouth daily.   lisinopril-hydrochlorothiazide (ZESTORETIC) 20-12.5 MG tablet Take 2 tablets by mouth daily.   meloxicam (MOBIC) 15 MG tablet TAKE 1 TABLET DAILY   Multiple Vitamin (MULTIVITAMIN) tablet Take 1 tablet by mouth daily.   omeprazole (PRILOSEC) 40 MG capsule TAKE 1 CAPSULE DAILY   No facility-administered encounter medications on file as of 03/11/2021.    Allergies (verified) Codeine   History: Past Medical History:  Diagnosis Date   Hyperlipidemia    Hypertension    Skin cancer (melanoma) (HCC)    left upper arm   Thyroid disease    Past Surgical History:  Procedure Laterality Date   ABDOMINAL HYSTERECTOMY     SKIN LESION EXCISION     Family History  Adopted: Yes  Problem Relation Age of Onset   Asthma Son    Turner syndrome Daughter    Social History   Socioeconomic History   Marital status: Married    Spouse name: Gabriela Gardner  Number of children: 2   Years of education: 12   Highest education level: High school graduate  Occupational History   Occupation: retired  Tobacco Use   Smoking status: Former    Packs/day: 1.00    Years: 30.00    Pack years: 30.00    Types: Cigarettes    Quit date: 06/30/2006    Years since quitting: 14.7   Smokeless tobacco: Never  Vaping Use  Vaping Use: Never used  Substance and Sexual Activity   Alcohol use: Yes    Comment: occasional   Drug use: No   Sexual activity: Yes    Birth control/protection: Post-menopausal  Other Topics Concern   Not on file  Social History Narrative   Not on file   Social Determinants of Health   Financial Resource Strain: Low Risk    Difficulty of Paying Living Expenses: Not hard at all  Food Insecurity: No Food Insecurity   Worried About Charity fundraiser in the Last Year: Never true   [place] in the Last Year: Never true  Transportation  Needs: No Transportation Needs   Lack of Transportation (Medical): No   Lack of Transportation (Non-Medical): No  Physical Activity: Sufficiently Active   Days of Exercise per Week: 6 days   Minutes of Exercise per Session: 60 min  Stress: No Stress Concern Present   Feeling of Stress : Not at all  Social Connections: Socially Integrated   Frequency of Communication with Friends and Family: More than three times a week   Frequency of Social Gatherings with Friends and Family: More than three times a week   Attends Religious Services: More than 4 times per year   Active Member of [employer] or Organizations: Yes   Attends Music therapist: More than 4 times per year   Marital Status: Married    Tobacco Counseling Counseling given: Not Answered   Clinical Intake:                 Diabetic? No         Activities of Daily Living In your present state of health, do you have any difficulty performing the following activities: 03/11/2021 10/29/2020  Hearing? N N  Vision? N N  Difficulty concentrating or making decisions? N N  Walking or climbing stairs? N N  Dressing or bathing? N N  Doing errands, shopping? N N  Preparing Food and eating ? N -  Using the Toilet? N -  In the past six months, have you accidently leaked urine? N -  Do you have problems with loss of bowel control? N -  Managing your Medications? N -  Managing your Finances? N -  Housekeeping or managing your Housekeeping? N -  Some recent data might be hidden    Patient Care Team: Gabriela Pretty, FNP as PCP - General (Nurse Practitioner)  Indicate any recent Medical Services you may have received from other than [facility] providers in the past year (date may be approximate).     Assessment:   This is a routine wellness examination for Gabriela Gardner.  Hearing/Vision screen Hearing Screening - Comments:: Denies hearing difficulties  Vision Screening - Comments:: Wears eyeglasses prn - up to date  with annual eye exams with Dr Clide Dales at [place] issues and exercise activities discussed: Current Exercise Habits: Home exercise routine, Type of exercise: treadmill, Time (Minutes): 60, Frequency (Times/Week): 6, Weekly Exercise (Minutes/Week): 360, Intensity: Mild, Exercise limited by: None identified   Goals Addressed             This Visit's Progress    DIET - INCREASE WATER INTAKE   On track    Try to drink 6-8 glasses of water daily       Depression Screen PHQ 2/9 Scores 03/11/2021 10/29/2020 05/01/2020 10/28/2019 09/16/2019 07/09/2018 01/22/2018  PHQ - 2 Score 0 0 0 0 0 0 0    Fall  Risk Fall Risk  03/11/2021 10/29/2020 05/01/2020 10/28/2019 09/16/2019  Falls in the past year? 0 0 0 0 0  Number falls in past yr: 0 - - - -  Injury with Fall? 0 - - - -  Comment - - - - -  Risk for fall due to : No Fall Risks - - - -  Follow up Falls prevention discussed - - - -    FALL RISK PREVENTION PERTAINING TO THE HOME:  Any stairs in or around the home? Yes  If so, are there any without handrails? No  Home free of loose throw rugs in walkways, pet beds, electrical cords, etc? Yes  Adequate lighting in your home to reduce risk of falls? Yes   ASSISTIVE DEVICES UTILIZED TO PREVENT FALLS:  Life alert? No  Use of a cane, walker or w/c? No  Grab bars in the bathroom? No  Shower chair or bench in shower? Yes  Elevated toilet seat or a handicapped toilet? Yes   TIMED UP AND GO:  Was the test performed? No . Telephonic visit  Cognitive Function: Normal cognitive status assessed by direct observation by this Nurse Health Advisor. No abnormalities found.       6CIT Screen 09/16/2019  What Year? 0 points  What month? 0 points  What time? 0 points  Count back from 20 0 points  Months in reverse 0 points  Repeat phrase 0 points  Total Score 0    Immunizations Immunization History  Administered Date(s) Administered   Influenza, High Dose Seasonal PF 04/01/2016    Influenza,inj,Quad PF,6+ Mos 09/07/2014, 04/11/2015   Influenza-Unspecified 03/13/2017, 03/01/2019, 03/29/2020   Moderna SARS-COV2 Booster Vaccination 05/03/2020, 01/28/2021   Moderna Sars-Covid-2 Vaccination 07/23/2019, 08/20/2019   Pneumococcal Conjugate-13 12/27/2014   Pneumococcal Polysaccharide-23 09/19/2016   Zoster, Live 02/22/2014    TDAP status: Due, Education has been provided regarding the importance of this vaccine. Advised may receive this vaccine at local pharmacy or Health Dept. Aware to provide a copy of the vaccination record if obtained from local pharmacy or Health Dept. Verbalized acceptance and understanding.  Flu Vaccine status: Up to date  Pneumococcal vaccine status: Up to date  Covid-19 vaccine status: Completed vaccines  Qualifies for Shingles Vaccine? Yes   Zostavax completed Yes   Shingrix Completed?: No.    Education has been provided regarding the importance of this vaccine. Patient has been advised to call insurance company to determine out of pocket expense if they have not yet received this vaccine. Advised may also receive vaccine at local pharmacy or Health Dept. Verbalized acceptance and understanding.  Screening Tests Health Maintenance  Topic Date Due   Zoster Vaccines- Shingrix (1 of 2) Never done   INFLUENZA VACCINE  01/28/2021   TETANUS/TDAP  10/29/2021 (Originally 02/29/2016)   MAMMOGRAM  02/27/2022   DEXA SCAN  10/30/2022   COLONOSCOPY (Pts 45-76yr Insurance coverage will need to be confirmed)  01/19/2023   COVID-19 Vaccine  Completed   Hepatitis C Screening  Completed   PNA vac Low Risk Adult  Completed   HPV VACCINES  Aged Out    Health Maintenance  Health Maintenance Due  Topic Date Due   Zoster Vaccines- Shingrix (1 of 2) Never done   INFLUENZA VACCINE  01/28/2021    Colorectal cancer screening: Type of screening: Colonoscopy. Completed 01/18/2013. Repeat every 10 years  Mammogram status: Completed 02/27/2021. Repeat every year  - she has repeat diagnostic mammo + ultrasound scheduled for 03/16/21  Bone Density  status: Completed 10/29/2020. Results reflect: Bone density results: OSTEOPENIA. Repeat every 2 years.  Lung Cancer Screening: (Low Dose CT Chest recommended if Age 3-80 years, 30 pack-year currently smoking OR have quit w/in 15years.) does not qualify.   Additional Screening:  Hepatitis C Screening: does qualify; Completed 10/11/2015  Vision Screening: Recommended annual ophthalmology exams for early detection of glaucoma and other disorders of the eye. Is the patient up to date with their annual eye exam?  Yes  Who is the provider or what is the name of the office in which the patient attends annual eye exams? [facility] If pt is not established with a provider, would they like to be referred to a provider to establish care? No .   Dental Screening: Recommended annual dental exams for proper oral hygiene  Community Resource Referral / Chronic Care Management: CRR required this visit?  No   CCM required this visit?  No      Plan:     I have personally reviewed and noted the following in the patient's chart:   Medical and social history Use of alcohol, tobacco or illicit drugs  Current medications and supplements including opioid prescriptions.  Functional ability and status Nutritional status Physical activity Advanced directives List of other physicians Hospitalizations, surgeries, and ER visits in previous 12 months Vitals Screenings to include cognitive, depression, and falls Referrals and appointments  In addition, I have reviewed and discussed with patient certain preventive protocols, quality metrics, and best practice recommendations. A written personalized care plan for preventive services as well as general preventive health recommendations were provided to patient.     Sandrea Hammond, LPN   [id number]   Nurse Notes: None

## 2021-03-16 ENCOUNTER — Ambulatory Visit: Payer: Medicare HMO

## 2021-03-16 ENCOUNTER — Ambulatory Visit
Admission: RE | Admit: 2021-03-16 | Discharge: 2021-03-16 | Disposition: A | Discharge status: Home / Self Care | Payer: Medicare HMO | Source: Ambulatory Visit | Attending: Nurse Practitioner | Admitting: Nurse Practitioner

## 2021-03-16 ENCOUNTER — Other Ambulatory Visit: Payer: Self-pay

## 2021-03-16 DIAGNOSIS — R928 Other abnormal and inconclusive findings on diagnostic imaging of breast: Secondary | ICD-10-CM | POA: Diagnosis not present

## 2021-03-16 DIAGNOSIS — R922 Inconclusive mammogram: Secondary | ICD-10-CM | POA: Diagnosis not present

## 2021-03-31 ENCOUNTER — Other Ambulatory Visit: Payer: Self-pay | Admitting: Nurse Practitioner

## 2021-03-31 DIAGNOSIS — M1990 Unspecified osteoarthritis, unspecified site: Secondary | ICD-10-CM

## 2021-05-01 ENCOUNTER — Other Ambulatory Visit: Payer: Self-pay

## 2021-05-01 ENCOUNTER — Ambulatory Visit (INDEPENDENT_AMBULATORY_CARE_PROVIDER_SITE_OTHER): Payer: Medicare HMO | Admitting: Nurse Practitioner

## 2021-05-01 ENCOUNTER — Encounter: Payer: Self-pay | Admitting: Nurse Practitioner

## 2021-05-01 VITALS — BP 132/84 | HR 61 | Temp 97.3°F | Resp 20 | Ht 64.0 in | Wt 160.0 lb

## 2021-05-01 DIAGNOSIS — I10 Essential (primary) hypertension: Secondary | ICD-10-CM | POA: Diagnosis not present

## 2021-05-01 DIAGNOSIS — E7800 Pure hypercholesterolemia, unspecified: Secondary | ICD-10-CM | POA: Diagnosis not present

## 2021-05-01 DIAGNOSIS — K219 Gastro-esophageal reflux disease without esophagitis: Secondary | ICD-10-CM | POA: Diagnosis not present

## 2021-05-01 DIAGNOSIS — E039 Hypothyroidism, unspecified: Secondary | ICD-10-CM | POA: Diagnosis not present

## 2021-05-01 DIAGNOSIS — D75839 Thrombocytosis, unspecified: Secondary | ICD-10-CM

## 2021-05-01 DIAGNOSIS — F5101 Primary insomnia: Secondary | ICD-10-CM

## 2021-05-01 DIAGNOSIS — Z6828 Body mass index (BMI) 28.0-28.9, adult: Secondary | ICD-10-CM

## 2021-05-01 DIAGNOSIS — R69 Illness, unspecified: Secondary | ICD-10-CM | POA: Diagnosis not present

## 2021-05-01 MED ORDER — ATORVASTATIN CALCIUM 40 MG PO TABS: 40.0000 mg | ORAL_TABLET | Freq: Every day | ORAL | 1 refills | Status: DC

## 2021-05-01 MED ORDER — AMLODIPINE BESYLATE 5 MG PO TABS: 5.0000 mg | ORAL_TABLET | Freq: Every day | ORAL | 1 refills | Status: DC

## 2021-05-01 MED ORDER — FERROUS SULFATE 325 (65 FE) MG PO TBEC: 325.0000 mg | DELAYED_RELEASE_TABLET | Freq: Two times a day (BID) | ORAL | 1 refills | Status: DC

## 2021-05-01 MED ORDER — LISINOPRIL-HYDROCHLOROTHIAZIDE 20-12.5 MG PO TABS: 2.0000 | ORAL_TABLET | Freq: Every day | ORAL | 1 refills | Status: DC

## 2021-05-01 MED ORDER — LEVOTHYROXINE SODIUM 100 MCG PO TABS: 100.0000 ug | ORAL_TABLET | Freq: Every day | ORAL | 1 refills | Status: DC

## 2021-05-01 MED ORDER — OMEPRAZOLE 40 MG PO CPDR: DELAYED_RELEASE_CAPSULE | ORAL | 1 refills | Status: DC

## 2021-05-01 NOTE — Progress Notes (Signed)
Subjective:    Patient ID: Gabriela Gardner, female    DOB: 1951/05/28, 70 y.o.   MRN: [id number]   Chief Complaint: medical management of chronic issues     HPI:  1. Primary hypertension No c/o chest pain, sob or headache. Does not check blood pressure at home. BP Readings from Last 3 Encounters:  05/01/21 (!) 152/78  10/29/20 131/71  05/01/20 (!) 142/80     2. Pure hypercholesterolemia Does try to watch diet and stays very active. Lab Results  Component Value Date   CHOL 185 10/29/2020   HDL 52 10/29/2020   LDLCALC 90 10/29/2020   TRIG 259 (H) 10/29/2020   CHOLHDL 3.6 10/29/2020     3. Gastroesophageal reflux disease without esophagitis Is on omeprazole daily and that seems to work well for her  4. Acquired hypothyroidism No problems that she is aware of. Lab Results  Component Value Date   TSH 1.820 10/29/2020     5. Primary insomnia Sleep has improved. She uses unisom and sleeps about 7 hours a night  6. BMI 28.0-28.9,adult No recent weight changes Wt Readings from Last 3 Encounters:  05/01/21 160 lb (72.6 kg)  03/11/21 159 lb (72.1 kg)  10/29/20 167 lb (75.8 kg)   BMI Readings from Last 3 Encounters:  05/01/21 27.46 kg/m  03/11/21 27.29 kg/m  10/29/20 28.67 kg/m       Outpatient Encounter Medications as of 05/01/2021  Medication Sig   amLODipine (NORVASC) 5 MG tablet Take 1 tablet (5 mg total) by mouth daily.   atorvastatin (LIPITOR) 40 MG tablet Take 1 tablet (40 mg total) by mouth daily.   calcium carbonate (OS-CAL) 600 MG TABS Take 600 mg by mouth daily.   ferrous sulfate 325 (65 FE) MG EC tablet Take 1 tablet (325 mg total) by mouth 2 (two) times daily before a meal.   levothyroxine (SYNTHROID) 100 MCG tablet Take 1 tablet (100 mcg total) by mouth daily.   lisinopril-hydrochlorothiazide (ZESTORETIC) 20-12.5 MG tablet Take 2 tablets by mouth daily.   meloxicam (MOBIC) 15 MG tablet TAKE 1 TABLET DAILY   Multiple Vitamin (MULTIVITAMIN) tablet  Take 1 tablet by mouth daily.   omeprazole (PRILOSEC) 40 MG capsule TAKE 1 CAPSULE DAILY   No facility-administered encounter medications on file as of 05/01/2021.    Past Surgical History:  Procedure Laterality Date   ABDOMINAL HYSTERECTOMY     SKIN LESION EXCISION      Family History  Adopted: Yes  Problem Relation Age of Onset   Asthma Son    Turner syndrome Daughter     New complaints: None today  Social history: Lives with her husband  Controlled substance contract: n/a     Review of Systems  Constitutional:  Negative for diaphoresis.  Eyes:  Negative for pain.  Respiratory:  Negative for shortness of breath.   Cardiovascular:  Negative for chest pain, palpitations and leg swelling.  Gastrointestinal:  Negative for abdominal pain.  Endocrine: Negative for polydipsia.  Skin:  Negative for rash.  Neurological:  Negative for dizziness, weakness and headaches.  Hematological:  Does not bruise/bleed easily.  All other systems reviewed and are negative.     Objective:   Physical Exam Vitals and nursing note reviewed.  Constitutional:      General: She is not in acute distress.    Appearance: Normal appearance. She is well-developed.  HENT:     Head: Normocephalic.     Right Ear: Tympanic membrane normal.  Left Ear: Tympanic membrane normal.     Nose: Nose normal.     Mouth/Throat:     Mouth: Mucous membranes are moist.  Eyes:     Pupils: Pupils are equal, round, and reactive to light.  Neck:     Vascular: No carotid bruit or JVD.  Cardiovascular:     Rate and Rhythm: Normal rate and regular rhythm.     Heart sounds: Normal heart sounds.  Pulmonary:     Effort: Pulmonary effort is normal. No respiratory distress.     Breath sounds: Normal breath sounds. No wheezing or rales.  Chest:     Chest wall: No tenderness.  Abdominal:     General: Bowel sounds are normal. There is no distension or abdominal bruit.     Palpations: Abdomen is soft. There is  no hepatomegaly, splenomegaly, mass or pulsatile mass.     Tenderness: There is no abdominal tenderness.  Musculoskeletal:        General: Normal range of motion.     Cervical back: Normal range of motion and neck supple.  Lymphadenopathy:     Cervical: No cervical adenopathy.  Skin:    General: Skin is warm and dry.  Neurological:     Mental Status: She is alert and oriented to person, place, and time.     Deep Tendon Reflexes: Reflexes are normal and symmetric.  Psychiatric:        Behavior: Behavior normal.        Thought Content: Thought content normal.        Judgment: Judgment normal.    BP 132/84   Pulse 61   Temp (!) 97.3 F (36.3 C) (Temporal)   Resp 20   Ht _0  (1.626 m)   Wt 160 lb (72.6 kg)   SpO2 99%   BMI 27.46 kg/m         Assessment & Plan:  Gabriela Gardner comes in today with chief complaint of Medical Management of Chronic Issues   Diagnosis and orders addressed:  1. Primary hypertension Ow sodium diet - amLODipine (NORVASC) 5 MG tablet; Take 1 tablet (5 mg total) by mouth daily.  Dispense: 90 tablet; Refill: 1 - lisinopril-hydrochlorothiazide (ZESTORETIC) 20-12.5 MG tablet; Take 2 tablets by mouth daily.  Dispense: 180 tablet; Refill: 1 - CBC with Differential/Platelet - CMP14+EGFR  2. Pure hypercholesterolemia Low fat diet - atorvastatin (LIPITOR) 40 MG tablet; Take 1 tablet (40 mg total) by mouth daily.  Dispense: 90 tablet; Refill: 1 - Lipid panel  3. Gastroesophageal reflux disease without esophagitis Avoid spicy foods Do not eat 2 hours prior to bedtime - omeprazole (PRILOSEC) 40 MG capsule; TAKE 1 CAPSULE DAILY  Dispense: 90 capsule; Refill: 1  4. Acquired hypothyroidism Labs pending - levothyroxine (SYNTHROID) 100 MCG tablet; Take 1 tablet (100 mcg total) by mouth daily.  Dispense: 90 tablet; Refill: 1  5. Primary insomnia Bedtime routine  6. BMI 28.0-28.9,adult Discussed diet and exercise for person with BMI >25 Will recheck  weight in 3-6 months   7. Thrombocytosis - ferrous sulfate 325 (65 FE) MG EC tablet; Take 1 tablet (325 mg total) by mouth 2 (two) times daily before a meal.  Dispense: 90 tablet; Refill: 1   Labs pending Health Maintenance reviewed Diet and exercise encouraged  Follow up plan: 6 months   Mary-Margaret Hassell Done, FNP

## 2021-05-01 NOTE — Patient Instructions (Signed)
Exercising to Stay Healthy °To become healthy and stay healthy, it is recommended that you do moderate-intensity and vigorous-intensity exercise. You can tell that you are exercising at a moderate intensity if your heart starts beating faster and you start breathing faster but can still hold a conversation. You can tell that you are exercising at a vigorous intensity if you are breathing much harder and faster and cannot hold a conversation while exercising. °How can exercise benefit me? °Exercising regularly is important. It has many health benefits, such as: °Improving overall fitness, flexibility, and endurance. °Increasing bone density. °Helping with weight control. °Decreasing body fat. °Increasing muscle strength and endurance. °Reducing stress and tension, anxiety, depression, or anger. °Improving overall health. °What guidelines should I follow while exercising? °Before you start a new exercise program, talk with your health care provider. °Do not exercise so much that you hurt yourself, feel dizzy, or get very short of breath. °Wear comfortable clothes and wear shoes with good support. °Drink plenty of water while you exercise to prevent dehydration or heat stroke. °Work out until your breathing and your heartbeat get faster (moderate intensity). °How often should I exercise? °Choose an activity that you enjoy, and set realistic goals. Your health care provider can help you make an activity plan that is individually designed and works best for you. °Exercise regularly as told by your health care provider. This may include: °Doing strength training two times a week, such as: °Lifting weights. °Using resistance bands. °Push-ups. °Sit-ups. °Yoga. °Doing a certain intensity of exercise for a given amount of time. Choose from these options: °A total of 150 minutes of moderate-intensity exercise every week. °A total of 75 minutes of vigorous-intensity exercise every week. °A mix of moderate-intensity and  vigorous-intensity exercise every week. °Children, pregnant women, people who have not exercised regularly, people who are overweight, and older adults may need to talk with a health care provider about what activities are safe to perform. If you have a medical condition, be sure to talk with your health care provider before you start a new exercise program. °What are some exercise ideas? °Moderate-intensity exercise ideas include: °Walking 1 mile (1.6 km) in about 15 minutes. °Biking. °Hiking. °Golfing. °Dancing. °Water aerobics. °Vigorous-intensity exercise ideas include: °Walking 4.5 miles (7.2 km) or more in about 1 hour. °Jogging or running 5 miles (8 km) in about 1 hour. °Biking 10 miles (16.1 km) or more in about 1 hour. °Lap swimming. °Roller-skating or in-line skating. °Cross-country skiing. °Vigorous competitive sports, such as football, basketball, and soccer. °Jumping rope. °Aerobic dancing. °What are some everyday activities that can help me get exercise? °Yard work, such as: °Pushing a lawn mower. °Raking and bagging leaves. °Washing your car. °Pushing a stroller. °Shoveling snow. °Gardening. °Washing windows or floors. °How can I be more active in my day-to-day activities? °Use stairs instead of an elevator. °Take a walk during your lunch break. °If you drive, park your car farther away from your work or school. °If you take public transportation, get off one stop early and walk the rest of the way. °Stand up or walk around during all of your indoor phone calls. °Get up, stretch, and walk around every 30 minutes throughout the day. °Enjoy exercise with a friend. Support to continue exercising will help you keep a regular routine of activity. °Where to find more information °You can find more information about exercising to stay healthy from: °[place] Department of Health and Human Services: www.hhs.gov °Centers for Disease Control and Prevention (  CDC): www.cdc.gov °Summary °Exercising regularly is  important. It will improve your overall fitness, flexibility, and endurance. °Regular exercise will also improve your overall health. It can help you control your weight, reduce stress, and improve your bone density. °Do not exercise so much that you hurt yourself, feel dizzy, or get very short of breath. °Before you start a new exercise program, talk with your health care provider. °This information is not intended to replace advice given to you by your health care provider. Make sure you discuss any questions you have with your health care provider. °Document Revised: 10/12/2020 Document Reviewed: 10/12/2020 °Elsevier Patient Education © 2022 [employer] ° °

## 2021-05-02 LAB — CMP14+EGFR
ALT: 58 IU/L — ABNORMAL HIGH (ref 0–32)
AST: 49 IU/L — ABNORMAL HIGH (ref 0–40)
Albumin/Globulin Ratio: 1.9 (ref 1.2–2.2)
Albumin: 4.8 g/dL — ABNORMAL HIGH (ref 3.7–4.7)
Alkaline Phosphatase: 78 IU/L (ref 44–121)
BUN/Creatinine Ratio: 11 — ABNORMAL LOW (ref 12–28)
BUN: 12 mg/dL (ref 8–27)
Bilirubin Total: 0.8 mg/dL (ref 0.0–1.2)
CO2: 25 mmol/L (ref 20–29)
Calcium: 10.1 mg/dL (ref 8.7–10.3)
Chloride: 99 mmol/L (ref 96–106)
Creatinine, Ser: 1.09 mg/dL — ABNORMAL HIGH (ref 0.57–1.00)
Globulin, Total: 2.5 g/dL (ref 1.5–4.5)
Glucose: 124 mg/dL — ABNORMAL HIGH (ref 70–99)
Potassium: 4.6 mmol/L (ref 3.5–5.2)
Sodium: 140 mmol/L (ref 134–144)
Total Protein: 7.3 g/dL (ref 6.0–8.5)
eGFR: 54 mL/min/{1.73_m2} — ABNORMAL LOW (ref >59)

## 2021-05-02 LAB — THYROID PANEL WITH TSH
Free Thyroxine Index: 1.9 (ref 1.2–4.9)
T3 Uptake Ratio: 22 % — ABNORMAL LOW (ref 24–39)
T4, Total: 8.8 ug/dL (ref 4.5–12.0)
TSH: 1.54 u[IU]/mL (ref 0.450–4.500)

## 2021-05-02 LAB — CBC WITH DIFFERENTIAL/PLATELET
Basophils Absolute: 0.1 10*3/uL (ref 0.0–0.2)
Basos: 1 %
EOS (ABSOLUTE): 0.2 10*3/uL (ref 0.0–0.4)
Eos: 3 %
Hematocrit: 43 % (ref 34.0–46.6)
Hemoglobin: 14.3 g/dL (ref 11.1–15.9)
Immature Grans (Abs): 0 10*3/uL (ref 0.0–0.1)
Immature Granulocytes: 0 %
Lymphocytes Absolute: 1.1 10*3/uL (ref 0.7–3.1)
Lymphs: 17 %
MCH: 31.8 pg (ref 26.6–33.0)
MCHC: 33.3 g/dL (ref 31.5–35.7)
MCV: 96 fL (ref 79–97)
Monocytes Absolute: 0.4 10*3/uL (ref 0.1–0.9)
Monocytes: 7 %
Neutrophils Absolute: 4.7 10*3/uL (ref 1.4–7.0)
Neutrophils: 72 %
Platelets: 245 10*3/uL (ref 150–450)
RBC: 4.5 x10E6/uL (ref 3.77–5.28)
RDW: 11.9 % (ref 11.7–15.4)
WBC: 6.5 10*3/uL (ref 3.4–10.8)

## 2021-05-02 LAB — LIPID PANEL
Chol/HDL Ratio: 3.2 ratio (ref 0.0–4.4)
Cholesterol, Total: 174 mg/dL (ref 100–199)
HDL: 54 mg/dL (ref >39)
LDL Chol Calc (NIH): 83 mg/dL (ref 0–99)
Triglycerides: 226 mg/dL — ABNORMAL HIGH (ref 0–149)
VLDL Cholesterol Cal: 37 mg/dL (ref 5–40)

## 2021-06-29 ENCOUNTER — Other Ambulatory Visit: Payer: Self-pay | Admitting: Nurse Practitioner

## 2021-06-29 DIAGNOSIS — M1990 Unspecified osteoarthritis, unspecified site: Secondary | ICD-10-CM

## 2021-09-27 ENCOUNTER — Other Ambulatory Visit: Payer: Self-pay | Admitting: Nurse Practitioner

## 2021-09-27 DIAGNOSIS — M1990 Unspecified osteoarthritis, unspecified site: Secondary | ICD-10-CM

## 2021-10-29 ENCOUNTER — Encounter: Payer: Self-pay | Admitting: Nurse Practitioner

## 2021-10-29 ENCOUNTER — Ambulatory Visit (INDEPENDENT_AMBULATORY_CARE_PROVIDER_SITE_OTHER): Payer: Medicare HMO | Admitting: Nurse Practitioner

## 2021-10-29 VITALS — BP 140/77 | HR 63 | Temp 97.7°F | Resp 20 | Ht 64.0 in | Wt 162.0 lb

## 2021-10-29 DIAGNOSIS — D75839 Thrombocytosis, unspecified: Secondary | ICD-10-CM | POA: Diagnosis not present

## 2021-10-29 DIAGNOSIS — I10 Essential (primary) hypertension: Secondary | ICD-10-CM

## 2021-10-29 DIAGNOSIS — E7800 Pure hypercholesterolemia, unspecified: Secondary | ICD-10-CM

## 2021-10-29 DIAGNOSIS — Z6828 Body mass index (BMI) 28.0-28.9, adult: Secondary | ICD-10-CM | POA: Diagnosis not present

## 2021-10-29 DIAGNOSIS — K219 Gastro-esophageal reflux disease without esophagitis: Secondary | ICD-10-CM

## 2021-10-29 DIAGNOSIS — M6740 Ganglion, unspecified site: Secondary | ICD-10-CM | POA: Diagnosis not present

## 2021-10-29 DIAGNOSIS — R69 Illness, unspecified: Secondary | ICD-10-CM | POA: Diagnosis not present

## 2021-10-29 DIAGNOSIS — F5101 Primary insomnia: Secondary | ICD-10-CM

## 2021-10-29 DIAGNOSIS — Z23 Encounter for immunization: Secondary | ICD-10-CM

## 2021-10-29 DIAGNOSIS — M1990 Unspecified osteoarthritis, unspecified site: Secondary | ICD-10-CM

## 2021-10-29 DIAGNOSIS — E039 Hypothyroidism, unspecified: Secondary | ICD-10-CM | POA: Diagnosis not present

## 2021-10-29 MED ORDER — AMLODIPINE BESYLATE 5 MG PO TABS: 5.0000 mg | ORAL_TABLET | Freq: Every day | ORAL | 1 refills | Status: DC

## 2021-10-29 MED ORDER — MELOXICAM 15 MG PO TABS: 15.0000 mg | ORAL_TABLET | Freq: Every day | ORAL | 1 refills | Status: DC

## 2021-10-29 MED ORDER — OMEPRAZOLE 40 MG PO CPDR: DELAYED_RELEASE_CAPSULE | ORAL | 1 refills | Status: DC

## 2021-10-29 MED ORDER — ATORVASTATIN CALCIUM 40 MG PO TABS: 40.0000 mg | ORAL_TABLET | Freq: Every day | ORAL | 1 refills | Status: DC

## 2021-10-29 MED ORDER — LISINOPRIL-HYDROCHLOROTHIAZIDE 20-12.5 MG PO TABS: 2.0000 | ORAL_TABLET | Freq: Every day | ORAL | 1 refills | Status: DC

## 2021-10-29 NOTE — Addendum Note (Signed)
Addended by: Rolena Infante on: 10/29/2021 04:25 PM ? ? Modules accepted: Orders ? ?

## 2021-10-29 NOTE — Progress Notes (Signed)
? ?Subjective:  ? ? Patient ID: Gabriela Gardner, female    DOB: 1951-01-17, 71 y.o.   MRN: [id number] ? ? ?Chief Complaint: medical management of chronic issues  ?  ? ?HPI: ? ?Gabriela Gardner is a 71 y.o. who identifies as a female who was assigned female at birth.  ? ?Social history: ?Lives with: husband ?Work history: retired from [employer] ? ? ?Comes in today for follow up of the following chronic medical issues: ? ?1. Primary hypertension ?No c/o chest pain, sob or headache. Does not check blood pressure at home. ?BP Readings from Last 3 Encounters:  ?05/01/21 132/84  ?10/29/20 131/71  ?05/01/20 (!) 142/80  ? ? ? ?2. Pure hypercholesterolemia ?Does watch diet and walks several times a week. ?Lab Results  ?Component Value Date  ? CHOL 174 05/01/2021  ? HDL 54 05/01/2021  ? Phelps 83 05/01/2021  ? TRIG 226 (H) 05/01/2021  ? CHOLHDL 3.2 05/01/2021  ? ?The 10-year ASCVD risk score (Arnett DK, et al., 2019) is: 16% ? ? ?3. Acquired hypothyroidism ?No problem that she is aware of. ?Lab Results  ?Component Value Date  ? TSH 1.540 05/01/2021  ? ? ? ?4. Gastroesophageal reflux disease without esophagitis ?Is on omeprazole daly and is doing well. ? ?5. Thrombocytosis ?Lab Results  ?Component Value Date  ? PLT 245 05/01/2021  ? ? ? ?6. Primary insomnia ?Uses unisom night ly and sleeps well most nights ? ?7. BMI 28.0-28.9,adult ?No recent weight changes ?Wt Readings from Last 3 Encounters:  ?10/29/21 162 lb (73.5 kg)  ?05/01/21 160 lb (72.6 kg)  ?03/11/21 159 lb (72.1 kg)  ? ?BMI Readings from Last 3 Encounters:  ?10/29/21 27.81 kg/m?  ?05/01/21 27.46 kg/m?  ?03/11/21 27.29 kg/m?  ? ? ? ? ?New complaints: ?None today ? ?Allergies  ?Allergen Reactions  ? Codeine Rash  ? ?Outpatient Encounter Medications as of 10/29/2021  ?Medication Sig  ? amLODipine (NORVASC) 5 MG tablet Take 1 tablet (5 mg total) by mouth daily.  ? atorvastatin (LIPITOR) 40 MG tablet Take 1 tablet (40 mg total) by mouth daily.  ? calcium carbonate (OS-CAL) 600 MG TABS  Take 600 mg by mouth daily.  ? ferrous sulfate 325 (65 FE) MG EC tablet Take 1 tablet (325 mg total) by mouth 2 (two) times daily before a meal.  ? levothyroxine (SYNTHROID) 100 MCG tablet Take 1 tablet (100 mcg total) by mouth daily.  ? lisinopril-hydrochlorothiazide (ZESTORETIC) 20-12.5 MG tablet Take 2 tablets by mouth daily.  ? meloxicam (MOBIC) 15 MG tablet TAKE 1 TABLET DAILY  ? Multiple Vitamin (MULTIVITAMIN) tablet Take 1 tablet by mouth daily.  ? omeprazole (PRILOSEC) 40 MG capsule TAKE 1 CAPSULE DAILY  ? ?No facility-administered encounter medications on file as of 10/29/2021.  ? ? ?Past Surgical History:  ?Procedure Laterality Date  ? ABDOMINAL HYSTERECTOMY    ? SKIN LESION EXCISION    ? ? ?Family History  ?Adopted: Yes  ?Problem Relation Age of Onset  ? Asthma Son   ? Turner syndrome Daughter   ? ? ? ? ?Controlled substance contract: n/a ? ? ? ? ?Review of Systems  ?Constitutional:  Negative for diaphoresis.  ?Eyes:  Negative for pain.  ?Respiratory:  Negative for shortness of breath.   ?Cardiovascular:  Negative for chest pain, palpitations and leg swelling.  ?Gastrointestinal:  Negative for abdominal pain.  ?Endocrine: Negative for polydipsia.  ?Skin:  Negative for rash.  ?Neurological:  Negative for dizziness, weakness and headaches.  ?Hematological:  Does  not bruise/bleed easily.  ?All other systems reviewed and are negative. ? ?   ?Objective:  ? Physical Exam ?Vitals and nursing note reviewed.  ?Constitutional:   ?   General: She is not in acute distress. ?   Appearance: Normal appearance. She is well-developed.  ?HENT:  ?   Head: Normocephalic.  ?   Right Ear: Tympanic membrane normal.  ?   Left Ear: Tympanic membrane normal.  ?   Nose: Nose normal.  ?   Mouth/Throat:  ?   Mouth: Mucous membranes are moist.  ?Eyes:  ?   Pupils: Pupils are equal, round, and reactive to light.  ?Neck:  ?   Vascular: No carotid bruit or JVD.  ?Cardiovascular:  ?   Rate and Rhythm: Normal rate and regular rhythm.  ?    Heart sounds: Normal heart sounds.  ?Pulmonary:  ?   Effort: Pulmonary effort is normal. No respiratory distress.  ?   Breath sounds: Normal breath sounds. No wheezing or rales.  ?Chest:  ?   Chest wall: No tenderness.  ?Abdominal:  ?   General: Bowel sounds are normal. There is no distension or abdominal bruit.  ?   Palpations: Abdomen is soft. There is no hepatomegaly, splenomegaly, mass or pulsatile mass.  ?   Tenderness: There is no abdominal tenderness.  ?Musculoskeletal:     ?   General: Normal range of motion.  ?   Cervical back: Normal range of motion and neck supple.  ?Lymphadenopathy:  ?   Cervical: No cervical adenopathy.  ?Skin: ?   General: Skin is warm and dry.  ?   Comments: Ganglion cyst on dorsal surface of left hand  ?Neurological:  ?   Mental Status: She is alert and oriented to person, place, and time.  ?   Deep Tendon Reflexes: Reflexes are normal and symmetric.  ?Psychiatric:     ?   Behavior: Behavior normal.     ?   Thought Content: Thought content normal.     ?   Judgment: Judgment normal.  ? ? ?BP 140/77   Pulse 63   Temp 97.7 ?F (36.5 ?C) (Temporal)   Resp 20   Ht 5' 4"  (1.626 m)   Wt 162 lb (73.5 kg)   SpO2 100%   BMI 27.81 kg/m?  ? ? ? ?   ?Assessment & Plan:  ?Gabriela Gardner comes in today with chief complaint of Medical Management of Chronic Issues (Knot on top of left hand. Painful at times) ? ? ?Diagnosis and orders addressed: ? ?1. Primary hypertension ?Low sodium diet ?- amLODipine (NORVASC) 5 MG tablet; Take 1 tablet (5 mg total) by mouth daily.  Dispense: 90 tablet; Refill: 1 ?- lisinopril-hydrochlorothiazide (ZESTORETIC) 20-12.5 MG tablet; Take 2 tablets by mouth daily.  Dispense: 180 tablet; Refill: 1 ?- CBC with Differential/Platelet ?- CMP14+EGFR ? ?2. Pure hypercholesterolemia ?Low fat diet ?- atorvastatin (LIPITOR) 40 MG tablet; Take 1 tablet (40 mg total) by mouth daily.  Dispense: 90 tablet; Refill: 1 ?- Lipid panel ? ?3. Acquired hypothyroidism ?Labs pending ?-  Thyroid Panel With TSH ? ?4. Gastroesophageal reflux disease without esophagitis ?Avoid spicy foods ?Do not eat 2 hours prior to bedtime ?- omeprazole (PRILOSEC) 40 MG capsule; TAKE 1 CAPSULE DAILY  Dispense: 90 capsule; Refill: 1 ? ?5. Thrombocytosis ?Labs pending ? ?6. Primary insomnia ?Bedtime routine ? ?7. BMI 28.0-28.9,adult ?Discussed diet and exercise for person with BMI >25 ?Will recheck weight in 3-6 months ? ? ?8. Ganglion  cyst ?Patient well let me know when and if she wants something done ? ?9. Arthritis ?- meloxicam (MOBIC) 15 MG tablet; Take 1 tablet (15 mg total) by mouth daily.  Dispense: 90 tablet; Refill: 1 ? ? ?Labs pending ?Health Maintenance reviewed ?Diet and exercise encouraged ? ?Follow up plan: ?6 months ? ? ?Mary-Margaret Hassell Done, FNP ? ? ?

## 2021-10-29 NOTE — Patient Instructions (Signed)
Ganglion Cyst Drainage ?Ganglion cyst drainage is a procedure to drain a fluid-filled sac that forms near joints or on tendons or ligaments near joints (ganglion cyst). These cysts are filled with a clear fluid. They most often develop in the hand or wrist, but they can also develop in the shoulder, elbow, hip, knee, ankle, or foot. ?In this procedure, the fluid is drained from the cyst using a needle and syringe. This is called aspiration. You may need aspiration if: ?The cyst is large and unsightly. ?The cyst is painful or limits movement. ?The cyst is pushing on a nerve and causing numbness, tingling, or weakness. ?Aspiration may be done several times. If the cyst keeps coming back after aspiration, you may need surgery to remove the cyst. ?Tell a health care provider about: ?Any allergies you have. ?All medicines you are taking, including vitamins, herbs, eye drops, creams, and over-the-counter medicines. ?Any problems you or family members have had with anesthetic medicines. ?Any blood disorders you have. ?Any surgeries you have had. ?Any medical conditions you have. ?Whether you are pregnant or may be pregnant. ?What are the risks? ?Generally, this is a safe procedure. However, problems may occur, including: ?The cyst coming back again (recurrence). This is the most common risk. ?Stiffness or limited movement. ?Damage to nearby structures, such as tendons, nerves, or blood vessels. ?Infection. ?Bleeding. ?Allergic reactions to medicines. ?What happens before the procedure? ?You may have an imaging test, such as an X-ray, MRI, CT scan, or ultrasound. ?Ask your health care provider about: ?Changing or stopping your regular medicines. This is especially important if you are taking diabetes medicines or blood thinners. ?Taking medicines such as aspirin and ibuprofen. These medicines can thin your blood. Do not take these medicines unless your health care provider tells you to take them. ?Taking over-the-counter  medicines, vitamins, herbs, and supplements. ?Ask your health care provider if you should: ?Plan to have someone take you home from the hospital or clinic. ?Plan to have someone with you for 24 hours. ?Ask your health care provider what steps will be taken to help prevent infection. These steps may include washing skin with a germ-killing soap. ?What happens during the procedure? ?An IV may be inserted into one of your veins. ?You may be given: ?A medicine to help you relax (sedative). ?A medicine to numb the area (local anesthetic). This will be injected around the cyst. ?When the area is numb, a needle with a syringe will be inserted into the cyst. If the cyst is near a main blood vessel (artery), your health care provider may do an ultrasound to help avoid the artery while placing the needle. ?Fluid from the cyst will be drawn into the syringe to shrink the size of the cyst. ?In some cases, pressure may be applied to the cyst to force remaining fluid out of the cyst into the surrounding tissue under the skin. ?Medicine may be injected into the cyst to decrease inflammation. This medicine may be corticosteroids, ethanol, or hyaluronidase. ?The needle will be removed, and a small bandage (dressing) will be placed over the puncture site. ?The procedure may vary among health care providers and hospitals. ?What can I expect after the procedure? ?You will be able to go home right after the procedure. ?If you were given a sedative during the procedure, it can affect you for several hours. Do not drive or operate machinery until your health care provider says that it is safe. ?You may have to wear a splint or brace. ?  After the procedure, it is common to have some soreness, swelling, and stiffness in the affected area. ?Follow these instructions at home: ?If you have a splint or brace: ?Wear it as told by your health care provider. Remove it only as told by your health care provider. You may need to wear the splint or  brace for several days. ?Loosen the splint or brace if your fingers (or toes) tingle, become numb, or turn cold and blue. ?Keep the splint or brace clean. ?If the splint or brace is not waterproof: ?Do not let it get wet. ?Ask if you can remove it for bathing. If not, cover it with a watertight covering when you take a bath or shower. ?Managing pain, stiffness, and swelling ? ?If directed, put ice on the puncture area. To do this: ?If you have a removable splint or brace, remove it as told by your health care provider. ?Put ice in a plastic bag. ?Place a towel between your skin and the bag or between the splint or brace and the bag. ?Leave the ice on for 20 minutes, 2-3 times a day. ?Move your affected joint or extremity often to reduce stiffness and swelling. ?Raise (elevate) the puncture area above the level of your heart while you are sitting or lying down. ?Puncture site care ? ?Remove your bandage when your health care provider says you can. ?Check your puncture area every day for signs of infection. Check for: ?Redness, swelling, or pain. ?Fluid or blood. ?Warmth. ?Pus or a bad smell. ?Do not take baths, swim, or use a hot tub until your health care provider approves. ?Activity ?Return to your normal activities as told by your health care provider. Ask your health care provider what activities are safe for you. ?Avoid activities that cause pain. ?Ask your health care provider when it is safe to drive and when you should start doing movement exercises. ?General instructions ?Take over-the-counter and prescription medicines only as told by your health care provider. ?Do not use any products that contain nicotine or tobacco, such as cigarettes, e-cigarettes, and chewing tobacco. These can delay healing. If you need help quitting, ask your health care provider. ?Keep all follow-up visits as told by your health care provider. This is important. ?Contact a health care provider if: ?Medicine is not controlling your  pain. ?You have stiffness or swelling that gets worse. ?Your splint or brace is not fitting correctly, causing your fingers (or toes) to tingle, become numb, or turn cold and blue. ?You have any signs of infection at your puncture site. ?You have a fever. ?Summary ?A ganglion cyst is a fluid-filled sac that forms near joints or on tendons or ligaments near joints. ?Ganglion cysts most often develop in the hand or wrist, but they can also develop in the shoulder, elbow, hip, knee, ankle, or foot. ?In ganglion cyst drainage, a needle is placed into the cyst to drain it into a syringe (aspiration). ?Sometimes ganglion cysts come back after drainage. Aspiration may be done several times. ?If the cyst continues to come back after drainage, you may need surgery to remove the cyst. ?This information is not intended to replace advice given to you by your health care provider. Make sure you discuss any questions you have with your health care provider. ?Document Revised: 09/09/2019 Document Reviewed: 09/09/2019 ?Elsevier Patient Education ? [place]. ? ?

## 2021-10-30 LAB — LIPID PANEL
Chol/HDL Ratio: 3.9 ratio (ref 0.0–4.4)
Cholesterol, Total: 183 mg/dL (ref 100–199)
HDL: 47 mg/dL (ref >39)
LDL Chol Calc (NIH): 88 mg/dL (ref 0–99)
Triglycerides: 291 mg/dL — ABNORMAL HIGH (ref 0–149)
VLDL Cholesterol Cal: 48 mg/dL — ABNORMAL HIGH (ref 5–40)

## 2021-10-30 LAB — CBC WITH DIFFERENTIAL/PLATELET
Basophils Absolute: 0.1 10*3/uL (ref 0.0–0.2)
Basos: 1 %
EOS (ABSOLUTE): 0.2 10*3/uL (ref 0.0–0.4)
Eos: 4 %
Hematocrit: 40.4 % (ref 34.0–46.6)
Hemoglobin: 13.5 g/dL (ref 11.1–15.9)
Immature Grans (Abs): 0 10*3/uL (ref 0.0–0.1)
Immature Granulocytes: 0 %
Lymphocytes Absolute: 0.9 10*3/uL (ref 0.7–3.1)
Lymphs: 17 %
MCH: 31.8 pg (ref 26.6–33.0)
MCHC: 33.4 g/dL (ref 31.5–35.7)
MCV: 95 fL (ref 79–97)
Monocytes Absolute: 0.4 10*3/uL (ref 0.1–0.9)
Monocytes: 7 %
Neutrophils Absolute: 3.9 10*3/uL (ref 1.4–7.0)
Neutrophils: 71 %
Platelets: 235 10*3/uL (ref 150–450)
RBC: 4.25 x10E6/uL (ref 3.77–5.28)
RDW: 12.6 % (ref 11.7–15.4)
WBC: 5.5 10*3/uL (ref 3.4–10.8)

## 2021-10-30 LAB — CMP14+EGFR
ALT: 38 IU/L — ABNORMAL HIGH (ref 0–32)
AST: 34 IU/L (ref 0–40)
Albumin/Globulin Ratio: 2 (ref 1.2–2.2)
Albumin: 4.9 g/dL — ABNORMAL HIGH (ref 3.7–4.7)
Alkaline Phosphatase: 79 IU/L (ref 44–121)
BUN/Creatinine Ratio: 11 — ABNORMAL LOW (ref 12–28)
BUN: 12 mg/dL (ref 8–27)
Bilirubin Total: 0.8 mg/dL (ref 0.0–1.2)
CO2: 27 mmol/L (ref 20–29)
Calcium: 10.1 mg/dL (ref 8.7–10.3)
Chloride: 100 mmol/L (ref 96–106)
Creatinine, Ser: 1.08 mg/dL — ABNORMAL HIGH (ref 0.57–1.00)
Globulin, Total: 2.4 g/dL (ref 1.5–4.5)
Glucose: 124 mg/dL — ABNORMAL HIGH (ref 70–99)
Potassium: 4.8 mmol/L (ref 3.5–5.2)
Sodium: 140 mmol/L (ref 134–144)
Total Protein: 7.3 g/dL (ref 6.0–8.5)
eGFR: 55 mL/min/{1.73_m2} — ABNORMAL LOW (ref >59)

## 2021-10-30 LAB — THYROID PANEL WITH TSH
Free Thyroxine Index: 1.9 (ref 1.2–4.9)
T3 Uptake Ratio: 23 % — ABNORMAL LOW (ref 24–39)
T4, Total: 8.1 ug/dL (ref 4.5–12.0)
TSH: 1.57 u[IU]/mL (ref 0.450–4.500)

## 2021-11-15 DIAGNOSIS — K219 Gastro-esophageal reflux disease without esophagitis: Secondary | ICD-10-CM | POA: Diagnosis not present

## 2021-11-15 DIAGNOSIS — N393 Stress incontinence (female) (male): Secondary | ICD-10-CM | POA: Diagnosis not present

## 2021-11-15 DIAGNOSIS — Z791 Long term (current) use of non-steroidal anti-inflammatories (NSAID): Secondary | ICD-10-CM | POA: Diagnosis not present

## 2021-11-15 DIAGNOSIS — I10 Essential (primary) hypertension: Secondary | ICD-10-CM | POA: Diagnosis not present

## 2021-11-15 DIAGNOSIS — R69 Illness, unspecified: Secondary | ICD-10-CM | POA: Diagnosis not present

## 2021-11-15 DIAGNOSIS — E785 Hyperlipidemia, unspecified: Secondary | ICD-10-CM | POA: Diagnosis not present

## 2021-11-15 DIAGNOSIS — Z885 Allergy status to narcotic agent status: Secondary | ICD-10-CM | POA: Diagnosis not present

## 2021-11-15 DIAGNOSIS — Z85828 Personal history of other malignant neoplasm of skin: Secondary | ICD-10-CM | POA: Diagnosis not present

## 2021-11-15 DIAGNOSIS — M1990 Unspecified osteoarthritis, unspecified site: Secondary | ICD-10-CM | POA: Diagnosis not present

## 2021-11-15 DIAGNOSIS — E039 Hypothyroidism, unspecified: Secondary | ICD-10-CM | POA: Diagnosis not present

## 2021-11-18 ENCOUNTER — Other Ambulatory Visit: Payer: Self-pay | Admitting: Nurse Practitioner

## 2021-11-18 DIAGNOSIS — E039 Hypothyroidism, unspecified: Secondary | ICD-10-CM

## 2021-12-10 ENCOUNTER — Other Ambulatory Visit: Payer: Self-pay | Admitting: Nurse Practitioner

## 2021-12-10 DIAGNOSIS — Z1231 Encounter for screening mammogram for malignant neoplasm of breast: Secondary | ICD-10-CM

## 2022-01-08 ENCOUNTER — Ambulatory Visit (INDEPENDENT_AMBULATORY_CARE_PROVIDER_SITE_OTHER): Payer: Medicare HMO | Admitting: Nurse Practitioner

## 2022-01-08 ENCOUNTER — Encounter: Payer: Self-pay | Admitting: Nurse Practitioner

## 2022-01-08 VITALS — BP 132/74 | HR 63 | Temp 97.5°F | Resp 20 | Ht 64.0 in | Wt 160.0 lb

## 2022-01-08 DIAGNOSIS — M542 Cervicalgia: Secondary | ICD-10-CM

## 2022-01-08 MED ORDER — CYCLOBENZAPRINE HCL 5 MG PO TABS: 5.0000 mg | ORAL_TABLET | Freq: Three times a day (TID) | ORAL | 0 refills | Status: DC | PRN

## 2022-01-08 MED ORDER — METHYLPREDNISOLONE ACETATE 80 MG/ML IJ SUSP
80.0000 mg | Freq: Once | INTRAMUSCULAR | Status: AC
  Administered 2022-01-08: 80 mg via INTRAMUSCULAR

## 2022-01-08 MED ORDER — PREDNISONE 20 MG PO TABS: ORAL_TABLET | ORAL | 0 refills | Status: DC

## 2022-01-08 NOTE — Progress Notes (Signed)
   Subjective:    Patient ID: Gabriela Gardner, female    DOB: [birth year], 71 y.o.   MRN: [id number]   Chief Complaint: Neck Pain (Left side/)   Patient comes in today c/o necak pain   Neck Pain  This is a new problem. The current episode started in the past 7 days. The problem occurs intermittently. The problem has been waxing and waning. The pain is associated with nothing. The pain is present in the left side. The quality of the pain is described as aching. The pain is at a severity of 3/10. The pain is mild. The symptoms are aggravated by position (sitting). The pain is Same all the time. Pertinent negatives include no headaches, tingling or weakness. She has tried NSAIDs for the symptoms. The treatment provided mild relief.       Review of Systems  Musculoskeletal:  Positive for neck pain.  Neurological:  Negative for tingling, weakness and headaches.       Objective:   Physical Exam Constitutional:      Appearance: Normal appearance.  Cardiovascular:     Rate and Rhythm: Normal rate and regular rhythm.     Heart sounds: Normal heart sounds.  Pulmonary:     Effort: Pulmonary effort is normal.     Breath sounds: Normal breath sounds.  Musculoskeletal:     Comments: FROM pf cervical spine with pain on tilting to left Grips equal bil motor strength and sensation distally intact  Skin:    General: Skin is warm.  Neurological:     General: No focal deficit present.     Mental Status: She is alert and oriented to person, place, and time.  Psychiatric:        Mood and Affect: Mood normal.        Behavior: Behavior normal.    BP 132/74   Pulse 63   Temp (!) 97.5 F (36.4 C) (Temporal)   Resp 20   Ht '5\' 4"'$  (1.626 m)   Wt 160 lb (72.6 kg)   SpO2 97%   BMI 27.46 kg/m         Assessment & Plan:   Gabriela Gardner in today with chief complaint of Neck Pain (Left side/)   1. Cervical pain (neck) Moist heat Stretches RTO prn - predniSONE (DELTASONE) 20 MG tablet; 2  po at sametime daily for 5 days-  Dispense: 10 tablet; Refill: 0 - cyclobenzaprine (FLEXERIL) 5 MG tablet; Take 1 tablet (5 mg total) by mouth 3 (three) times daily as needed for muscle spasms.  Dispense: 30 tablet; Refill: 0 - methylPREDNISolone acetate (DEPO-MEDROL) injection 80 mg    The above assessment and management plan was discussed with the patient. The patient verbalized understanding of and has agreed to the management plan. Patient is aware to call the clinic if symptoms persist or worsen. Patient is aware when to return to the clinic for a follow-up visit. Patient educated on when it is appropriate to go to the emergency department.   Mary-Margaret Hassell Done, FNP

## 2022-01-08 NOTE — Patient Instructions (Signed)
Cervical Sprain A cervical sprain is a stretch or tear in one or more of the ligaments in the neck. Ligaments are the tissues that connect bones. Cervical sprains can range from mild to severe. Severe cervical sprains can cause the spinal bones (vertebrae) in the neck to be unstable. This can result in spinal cord damage and in serious nervous system problems. The time that it takes for a cervical sprain to heal depends on the cause and extent of the injury. Most cervical sprains heal in 4-6 weeks. What are the causes? Cervical sprains may be caused by trauma, such as an injury from a motor vehicle accident, a fall, or a sudden forward and backward whipping movement of the head and neck (whiplash injury). Mild cervical sprains may be caused by wear and tear over time. What increases the risk? The following factors may make you more likely to develop this condition: Participating in activities that have a high risk of trauma to the neck. These include contact sports, auto racing, gymnastics, and diving. Taking risks when driving or riding in a motor vehicle. Osteoarthritis of the spine. Poor strength and flexibility of the neck. A previous neck injury. Poor posture. Spending long periods in certain positions that put stress on the neck, such as sitting at a computer for a long time. What are the signs or symptoms? Symptoms of this condition include: Pain, soreness, stiffness, tenderness, swelling, or a burning sensation in the front, back, or sides of the neck, shoulders, or upper back. Sudden tightening of neck muscles (spasms). Limited ability to move the neck. Headache. Dizziness. Nausea or vomiting. Weakness, numbness, or tingling in a hand or an arm. Symptoms may develop right away after injury, or they may develop over a few days. In some cases, symptoms may go away with treatment and return (recur) over time. How is this diagnosed? This condition may be diagnosed based on: Your  medical history. Your symptoms. Any recent injuries or known neck problems that you have, such as arthritis in the neck. A physical exam. Imaging tests, such as X-rays, MRI, and CT scan. How is this treated? This condition is treated by resting and icing the injured area and doing physical therapy exercises. Heat therapy may be used 2-3 days after the injury occurred if there is no swelling. Depending on the severity of your condition, treatment may also include: Keeping your neck in place (immobilized) for periods of time. This may be done using: A cervical collar. This supports your chin and the back of your head. A cervical traction device. This is a sling that holds up your head. The device removes weight and pressure from your neck, and it may help to relieve pain. Medicines that help to relieve pain and inflammation. Medicines that help to relax your muscles (muscle relaxants). Surgery. This is rare. Follow these instructions at home: Medicines  Take over-the-counter and prescription medicines only as told by your health care provider. Ask your health care provider if the medicine prescribed to you: Requires you to avoid driving or using heavy machinery. Can cause constipation. You may need to take these actions to prevent or treat constipation: Drink enough fluid to keep your urine pale yellow. Take over-the-counter or prescription medicines. Eat foods that are high in fiber, such as beans, whole grains, and fresh fruits and vegetables. Limit foods that are high in fat and processed sugars, such as fried or sweet foods. If you have a cervical collar: Wear the collar as told by your  health care provider. Do not remove it unless told. Ask before making any adjustments to your collar. If you have long hair, keep it outside of the collar. Ask your health care provider if you may remove the collar for cleaning and bathing. If so: Follow instructions about how to remove it  safely. Clean it by hand with mild soap and water and air-dry it completely. If your collar has removable pads, remove them every 1-2 days and wash them by hand with soap and water. Let them air-dry completely before putting them back in the collar. Tell your health care provider if your skin under the collar has irritation or sores. Managing pain, stiffness, and swelling     If directed, use a cervical traction device as told. If directed, put ice on the affected area. To do this: Put ice in a plastic bag. Place a towel between your skin and the bag. Leave the ice on for 20 minutes, 2-3 times a day. If directed, apply heat to the affected area before you do your physical therapy or as often as told by your health care provider. Use the heat source that your health care provider recommends, such as a moist heat pack or a heating pad. Place a towel between your skin and the heat source. Leave the heat on for 20-30 minutes. Remove the heat if your skin turns bright red. This is especially important if you are unable to feel pain, heat, or cold. You may have a greater risk of getting burned. Activity Do not drive while wearing a cervical collar. If you do not have a cervical collar, ask if it is safe to drive while your neck heals. Do not lift anything that is heavier than 10 lb (4.5 kg), or the limit that you are told, until your health care provider says that it is safe. Rest as told by your health care provider. If physical therapy was prescribed, do exercises as told by your health care provider or physical therapist. Return to your normal activities as told by your health care provider. Avoid positions and activities that make your symptoms worse. Ask your health care provider what activities are safe for you. General instructions Do not use any products that contain nicotine or tobacco, such as cigarettes, e-cigarettes, and chewing tobacco. These can delay healing. If you need help  quitting, ask your health care provider. Keep all follow-up visits as told by your health care provider or physical therapist. This is important. How is this prevented? To prevent a cervical sprain from happening again: Use and maintain good posture. Make any needed adjustments to your workstation to help you do this. Exercise regularly as told by your health care provider or physical therapist. Avoid risky activities that may cause a cervical sprain. Contact a health care provider if you have: Symptoms that get worse or do not get better after 2 weeks of treatment. Pain that gets worse or does not get better with medicine. New, unexplained symptoms. Sores or irritated skin on your neck from wearing your cervical collar. Get help right away if: You have severe pain. You develop numbness, tingling, or weakness in any part of your body. You cannot move a part of your body (you have paralysis). You have neck pain along with severe dizziness or headache. Summary A cervical sprain is a stretch or tear in one or more of the ligaments in the neck. Cervical sprains may be caused by trauma, such as an injury from a motor vehicle   accident, a fall, or a sudden forward and backward whipping movement of the head and neck (whiplash injury). Symptoms may develop right away after injury, or they may develop over a few days. This condition may be treated with rest, ice, heat, medicines, physical therapy, and surgery. This information is not intended to replace advice given to you by your health care provider. Make sure you discuss any questions you have with your health care provider. Document Revised: 02/23/2019 Document Reviewed: 02/23/2019 Elsevier Patient Education  [place].

## 2022-03-12 DIAGNOSIS — Z85820 Personal history of malignant melanoma of skin: Secondary | ICD-10-CM | POA: Diagnosis not present

## 2022-03-12 DIAGNOSIS — D224 Melanocytic nevi of scalp and neck: Secondary | ICD-10-CM | POA: Diagnosis not present

## 2022-03-12 DIAGNOSIS — L821 Other seborrheic keratosis: Secondary | ICD-10-CM | POA: Diagnosis not present

## 2022-03-12 DIAGNOSIS — L738 Other specified follicular disorders: Secondary | ICD-10-CM | POA: Diagnosis not present

## 2022-03-12 DIAGNOSIS — D225 Melanocytic nevi of trunk: Secondary | ICD-10-CM | POA: Diagnosis not present

## 2022-03-19 ENCOUNTER — Ambulatory Visit
Admission: RE | Admit: 2022-03-19 | Discharge: 2022-03-19 | Disposition: A | Discharge status: Home / Self Care | Payer: Medicare HMO | Source: Ambulatory Visit | Attending: Nurse Practitioner | Admitting: Nurse Practitioner

## 2022-03-19 DIAGNOSIS — Z1231 Encounter for screening mammogram for malignant neoplasm of breast: Secondary | ICD-10-CM | POA: Diagnosis not present

## 2022-04-10 DIAGNOSIS — H52209 Unspecified astigmatism, unspecified eye: Secondary | ICD-10-CM | POA: Diagnosis not present

## 2022-04-10 DIAGNOSIS — H2513 Age-related nuclear cataract, bilateral: Secondary | ICD-10-CM | POA: Diagnosis not present

## 2022-04-10 DIAGNOSIS — H5203 Hypermetropia, bilateral: Secondary | ICD-10-CM | POA: Diagnosis not present

## 2022-04-11 ENCOUNTER — Ambulatory Visit (INDEPENDENT_AMBULATORY_CARE_PROVIDER_SITE_OTHER): Payer: Medicare HMO

## 2022-04-11 DIAGNOSIS — Z0000 Encounter for general adult medical examination without abnormal findings: Secondary | ICD-10-CM | POA: Diagnosis not present

## 2022-04-11 NOTE — Progress Notes (Signed)
MEDICARE ANNUAL WELLNESS VISIT  04/11/2022  Telephone Visit Disclaimer This Medicare AWV was conducted by telephone due to national recommendations for restrictions regarding the COVID-19 Pandemic (e.g. social distancing).  I verified, using two identifiers, that I am speaking with Gabriela Gardner or their authorized healthcare agent. I discussed the limitations, risks, security, and privacy concerns of performing an evaluation and management service by telephone and the potential availability of an in-person appointment in the future. The patient expressed understanding and agreed to proceed.  Location of Patient: Home Location of Provider (nurse):  [facility]  Subjective:    Gabriela Gardner is a 71 y.o. female patient of Chevis Pretty, [place] who had a Medicare Annual Wellness Visit today via telephone. Gabriela Gardner is a very pleasant lady who lives with her husband in nearby [place]. 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 She has two children. One lives in [place] and the other in [place]. They are very active and mow a couple different locations weekly and she walks 1/2 mile daily. She enjoys gardening, reading, and crafting.   Patient Care Team: Chevis Pretty, FNP as PCP - General (Nurse Practitioner)     04/11/2022    1:58 PM 03/11/2021    2:19 PM 09/16/2019    8:51 AM 11/11/2017    1:56 PM 09/29/2017    1:48 PM  Advanced Directives  Does Patient Have a Medical Advance Directive? No No No No No  Would patient like information on creating a medical advance directive? No - Patient declined No - Patient declined No - Patient declined No - Patient declined No - Patient declined    Hospital Utilization Over the Past 12 Months: # of hospitalizations or ER visits: 0 # of surgeries: 0  Review of Systems    Patient reports that her overall health is unchanged compared to last year.  History obtained from chart  review  Patient Reported Readings (BP, Pulse, CBG, Weight, etc) none  Pain Assessment Pain : No/denies pain     Current Medications & Allergies (verified) Allergies as of 04/11/2022       Reactions   Codeine Rash        Medication List        Accurate as of April 11, 2022  3:46 PM. If you have any questions, ask your nurse or doctor.          amLODipine 5 MG tablet Commonly known as: NORVASC Take 1 tablet (5 mg total) by mouth daily.   atorvastatin 40 MG tablet Commonly known as: LIPITOR Take 1 tablet (40 mg total) by mouth daily.   B-12 PO Take by mouth.   calcium carbonate 600 MG Tabs tablet Commonly known as: OS-CAL Take 600 mg by mouth daily.   cyclobenzaprine 5 MG tablet Commonly known as: FLEXERIL Take 1 tablet (5 mg total) by mouth 3 (three) times daily as needed for muscle spasms.   ferrous sulfate 325 (65 FE) MG EC tablet Take 1 tablet (325 mg total) by mouth 2 (two) times daily before a meal.   levothyroxine 100 MCG tablet Commonly known as: SYNTHROID TAKE 1 TABLET DAILY   lisinopril-hydrochlorothiazide 20-12.5 MG tablet Commonly known as: ZESTORETIC Take 2 tablets by mouth daily.   meloxicam 15 MG tablet Commonly known as: MOBIC Take 1 tablet (15 mg total) by mouth daily.   multivitamin tablet Take 1 tablet by mouth daily.   omeprazole 40 MG capsule Commonly  known as: PRILOSEC TAKE 1 CAPSULE DAILY   predniSONE 20 MG tablet Commonly known as: DELTASONE 2 po at sametime daily for 5 days-        History (reviewed): Past Medical History:  Diagnosis Date   Hyperlipidemia    Hypertension    Skin cancer (melanoma) ([place])    left upper arm   Thyroid disease    Past Surgical History:  Procedure Laterality Date   ABDOMINAL HYSTERECTOMY     SKIN LESION EXCISION     Family History  Adopted: Yes  Problem Relation Age of Onset   Turner syndrome Daughter    Asthma Son    Breast cancer Neg Hx    Social History    Socioeconomic History   Marital status: Married    Spouse name: Herbie [place]   Number of children: 2   Years of education: 12   Highest education level: High school graduate  Occupational History   Occupation: retired  Tobacco Use   Smoking status: Former    Packs/day: 1.00    Years: 30.00    Total pack years: 30.00    Types: Cigarettes    Quit date: 06/30/2006    Years since quitting: 15.7   Smokeless tobacco: Never  Vaping Use   Vaping Use: Never used  Substance and Sexual Activity   Alcohol use: Yes    Comment: occasional   Drug use: No   Sexual activity: Yes    Birth control/protection: Post-menopausal  Other Topics Concern   Not on file  Social History Narrative   Not on file   Social Determinants of Health   Financial Resource Strain: Low Risk  (04/11/2022)   Overall Financial Resource Strain (CARDIA)    Difficulty of Paying Living Expenses: Not hard at all  Food Insecurity: No Food Insecurity (04/11/2022)   Hunger Vital Sign    Worried About Running Out of Food in the Last Year: Never true    [place] in the Last Year: Never true  Transportation Needs: No Transportation Needs (04/11/2022)   PRAPARE - Hydrologist (Medical): No    Lack of Transportation (Non-Medical): No  Physical Activity: Sufficiently Active (04/11/2022)   Exercise Vital Sign    Days of Exercise per Week: 6 days    Minutes of Exercise per Session: 60 min  Stress: No Stress Concern Present (04/11/2022)   [place]    Feeling of Stress : Not at all  Social Connections: [place] (04/11/2022)   Social Connection and Isolation Panel [NHANES]    Frequency of Communication with Friends and Family: More than three times a week    Frequency of Social Gatherings with Friends and Family: More than three times a week    Attends Religious Services: More than 4 times per year    Active Member  of [employer] or Organizations: Yes    Attends Archivist Meetings: More than 4 times per year    Marital Status: Married    Activities of Daily Living    04/11/2022    1:58 PM 03/08/2022    1:02 PM  In your present state of health, do you have any difficulty performing the following activities:  Hearing? 0 0   0  Vision? 0 0   0  Difficulty concentrating or making decisions? 0 0   0  Walking or climbing stairs? 0 0   0  Dressing or bathing? 0  0   0  Doing errands, shopping? 0 0   0  Preparing Food and eating ? N N   N  Using the Toilet? N N   N  In the past six months, have you accidently leaked urine? N Y   Y  Do you have problems with loss of bowel control? N N   N  Managing your Medications? N N   N  Managing your Finances? N N   N  Housekeeping or managing your Housekeeping? N N   N    Patient Education/ Literacy How often do you need to have someone help you when you read instructions, pamphlets, or other written materials from your doctor or pharmacy?: 1 - Never What is the last grade level you completed in school?: 12th grade  Exercise Current Exercise Habits: The patient does not participate in regular exercise at present  Diet Patient reports consuming 2 meals a day and 1 snack(s) a day Patient reports that her primary diet is: Regular Patient reports that she does have regular access to food.   Depression Screen    04/11/2022    1:58 PM 01/08/2022    8:24 AM 10/29/2021    8:42 AM 05/01/2021    8:44 AM 03/11/2021    2:12 PM 10/29/2020    9:53 AM 05/01/2020   10:09 AM  PHQ 2/9 Scores  PHQ - 2 Score 0 0 0 0 0 0 0  PHQ- 9 Score  0 1 1        Fall Risk    04/11/2022    1:58 PM 03/08/2022    1:02 PM 01/08/2022    8:24 AM 10/29/2021    8:42 AM 05/01/2021    8:44 AM  [place] in the past year? 0 0   0 0 0 0     Objective:  Gabriela Gardner seemed alert and oriented and she participated appropriately during our telephone visit.  Blood  Pressure Weight BMI  BP Readings from Last 3 Encounters:  01/08/22 132/74  10/29/21 140/77  05/01/21 132/84   Wt Readings from Last 3 Encounters:  01/08/22 160 lb (72.6 kg)  10/29/21 162 lb (73.5 kg)  05/01/21 160 lb (72.6 kg)   BMI Readings from Last 1 Encounters:  01/08/22 27.46 kg/m    *Unable to obtain current vital signs, weight, and BMI due to telephone visit type  Hearing/Vision  Gabriela Gardner did not seem to have difficulty with hearing/understanding during the telephone conversation Reports that she has had a formal eye exam by an eye care professional within the past year Reports that she has not had a formal hearing evaluation within the past year *Unable to fully assess hearing and vision during telephone visit type  Cognitive Function:    04/11/2022    1:59 PM 09/16/2019    8:54 AM  6CIT Screen  What Year? 0 points 0 points  What month? 0 points 0 points  What time? 0 points 0 points  Count back from 20 0 points 0 points  Months in reverse 0 points 0 points  Repeat phrase 0 points 0 points  Total Score 0 points 0 points   (Normal:0-7, Significant for Dysfunction: >8)  Normal Cognitive Function Screening: Yes   Immunization & Health Maintenance Record Immunization History  Administered Date(s) Administered   Fluad Quad(high Dose 65+) 03/24/2021   Influenza, High Dose Seasonal PF 04/01/2016   Influenza,inj,Quad PF,6+ Mos 09/07/2014, 04/11/2015   Influenza-Unspecified 03/13/2017, 03/01/2019, 03/29/2020,  03/14/2022   Moderna SARS-COV2 Booster Vaccination 05/03/2020, 01/28/2021, 04/17/2021, 03/20/2022   Moderna Sars-Covid-2 Vaccination 07/23/2019, 08/20/2019   Pneumococcal Conjugate-13 12/27/2014   Pneumococcal Polysaccharide-23 09/19/2016   Tdap 10/29/2021   Zoster Recombinat (Shingrix) 10/29/2021   Zoster, Live 02/22/2014    Health Maintenance  Topic Date Due   Zoster Vaccines- Shingrix (2 of 2) 12/24/2021   COVID-19 Vaccine (3 - Moderna series) 05/15/2022    DEXA SCAN  10/30/2022   COLONOSCOPY (Pts 45-53yr Insurance coverage will need to be confirmed)  01/19/2023   MAMMOGRAM  03/20/2023   TETANUS/TDAP  10/30/2031   Pneumonia Vaccine 72 Years old  Completed   INFLUENZA VACCINE  Completed   Hepatitis C Screening  Completed   HPV VACCINES  Aged Out       Assessment  This is a routine wellness examination for NDynegy  Health Maintenance: Due or Overdue Health Maintenance Due  Topic Date Due   Zoster Vaccines- Shingrix (2 of 2) 12/24/2021    Gabriela Cavesdoes not need a referral for Community Assistance: Care Management:   no Social Work:    no Prescription Assistance:  no Nutrition/Diabetes Education:  no   Plan:  Personalized Goals  Goals Addressed             This Visit's Progress    Exercise 150 min/wk Moderate Activity         Personalized Health Maintenance & Screening Recommendations  2nd shingrix vaccine at next office visit  Lung Cancer Screening Recommended: no (Low Dose CT Chest recommended if Age 72-80years, 30 pack-year currently smoking OR have quit w/in past 15 years) Hepatitis C Screening recommended: Done HIV Screening recommended: no  Advanced Directives: Written information was not prepared per patient's request.  Referrals & Orders No orders of the defined types were placed in this encounter.   Follow-up Plan Follow-up with MChevis Pretty FNP as planned Schedule for regular chronic follow up    I have personally reviewed and noted the following in the patient's chart:   Medical and social history Use of alcohol, tobacco or illicit drugs  Current medications and supplements Functional ability and status Nutritional status Physical activity Advanced directives List of other physicians Hospitalizations, surgeries, and ER visits in previous 12 months Vitals Screenings to include cognitive, depression, and falls Referrals and appointments  In addition, I have reviewed  and discussed with Gabriela Cavescertain preventive protocols, quality metrics, and best practice recommendations. A written personalized care plan for preventive services as well as general preventive health recommendations is available and can be mailed to the patient at her request.      ARolena InfanteLPN [id number]

## 2022-04-21 ENCOUNTER — Other Ambulatory Visit: Payer: Self-pay | Admitting: Nurse Practitioner

## 2022-04-21 DIAGNOSIS — K219 Gastro-esophageal reflux disease without esophagitis: Secondary | ICD-10-CM

## 2022-04-21 DIAGNOSIS — I10 Essential (primary) hypertension: Secondary | ICD-10-CM

## 2022-04-21 DIAGNOSIS — E7800 Pure hypercholesterolemia, unspecified: Secondary | ICD-10-CM

## 2022-05-02 ENCOUNTER — Encounter: Payer: Self-pay | Admitting: Nurse Practitioner

## 2022-05-02 ENCOUNTER — Ambulatory Visit (INDEPENDENT_AMBULATORY_CARE_PROVIDER_SITE_OTHER): Payer: Medicare HMO | Admitting: Nurse Practitioner

## 2022-05-02 VITALS — BP 126/79 | HR 63 | Temp 97.6°F | Resp 20 | Ht 64.0 in | Wt 160.0 lb

## 2022-05-02 DIAGNOSIS — I10 Essential (primary) hypertension: Secondary | ICD-10-CM | POA: Diagnosis not present

## 2022-05-02 DIAGNOSIS — F5101 Primary insomnia: Secondary | ICD-10-CM

## 2022-05-02 DIAGNOSIS — Z23 Encounter for immunization: Secondary | ICD-10-CM

## 2022-05-02 DIAGNOSIS — E039 Hypothyroidism, unspecified: Secondary | ICD-10-CM

## 2022-05-02 DIAGNOSIS — Z6828 Body mass index (BMI) 28.0-28.9, adult: Secondary | ICD-10-CM

## 2022-05-02 DIAGNOSIS — K219 Gastro-esophageal reflux disease without esophagitis: Secondary | ICD-10-CM | POA: Diagnosis not present

## 2022-05-02 DIAGNOSIS — E7800 Pure hypercholesterolemia, unspecified: Secondary | ICD-10-CM

## 2022-05-02 DIAGNOSIS — R69 Illness, unspecified: Secondary | ICD-10-CM | POA: Diagnosis not present

## 2022-05-02 MED ORDER — LEVOTHYROXINE SODIUM 100 MCG PO TABS: 100.0000 ug | ORAL_TABLET | Freq: Every day | ORAL | 1 refills | Status: DC

## 2022-05-02 MED ORDER — OMEPRAZOLE 40 MG PO CPDR: 40.0000 mg | DELAYED_RELEASE_CAPSULE | Freq: Every day | ORAL | 1 refills | Status: DC

## 2022-05-02 MED ORDER — AMLODIPINE BESYLATE 5 MG PO TABS: 5.0000 mg | ORAL_TABLET | Freq: Every day | ORAL | 1 refills | Status: DC

## 2022-05-02 MED ORDER — ATORVASTATIN CALCIUM 40 MG PO TABS: 40.0000 mg | ORAL_TABLET | Freq: Every day | ORAL | 1 refills | Status: DC

## 2022-05-02 MED ORDER — LISINOPRIL-HYDROCHLOROTHIAZIDE 20-12.5 MG PO TABS: 2.0000 | ORAL_TABLET | Freq: Every day | ORAL | 1 refills | Status: DC

## 2022-05-02 NOTE — Addendum Note (Signed)
Addended by: Rolena Infante on: 05/02/2022 10:57 AM   Modules accepted: Orders

## 2022-05-02 NOTE — Addendum Note (Signed)
Addended by: Chevis Pretty on: 05/02/2022 09:13 AM   Modules accepted: Level of Service

## 2022-05-02 NOTE — Patient Instructions (Signed)
Exercising to Stay Healthy To become healthy and stay healthy, it is recommended that you do moderate-intensity and vigorous-intensity exercise. You can tell that you are exercising at a moderate intensity if your heart starts beating faster and you start breathing faster but can still hold a conversation. You can tell that you are exercising at a vigorous intensity if you are breathing much harder and faster and cannot hold a conversation while exercising. How can exercise benefit me? Exercising regularly is important. It has many health benefits, such as: Improving overall fitness, flexibility, and endurance. Increasing bone density. Helping with weight control. Decreasing body fat. Increasing muscle strength and endurance. Reducing stress and tension, anxiety, depression, or anger. Improving overall health. What guidelines should I follow while exercising? Before you start a new exercise program, talk with your health care provider. Do not exercise so much that you hurt yourself, feel dizzy, or get very short of breath. Wear comfortable clothes and wear shoes with good support. Drink plenty of water while you exercise to prevent dehydration or heat stroke. Work out until your breathing and your heartbeat get faster (moderate intensity). How often should I exercise? Choose an activity that you enjoy, and set realistic goals. Your health care provider can help you make an activity plan that is individually designed and works best for you. Exercise regularly as told by your health care provider. This may include: Doing strength training two times a week, such as: Lifting weights. Using resistance bands. Push-ups. Sit-ups. Yoga. Doing a certain intensity of exercise for a given amount of time. Choose from these options: A total of 150 minutes of moderate-intensity exercise every week. A total of 75 minutes of vigorous-intensity exercise every week. A mix of moderate-intensity and  vigorous-intensity exercise every week. Children, pregnant women, people who have not exercised regularly, people who are overweight, and older adults may need to talk with a health care provider about what activities are safe to perform. If you have a medical condition, be sure to talk with your health care provider before you start a new exercise program. What are some exercise ideas? Moderate-intensity exercise ideas include: Walking 1 mile (1.6 km) in about 15 minutes. Biking. Hiking. Golfing. Dancing. Water aerobics. Vigorous-intensity exercise ideas include: Walking 4.5 miles (7.2 km) or more in about 1 hour. Jogging or running 5 miles (8 km) in about 1 hour. Biking 10 miles (16.1 km) or more in about 1 hour. Lap swimming. Roller-skating or in-line skating. Cross-country skiing. Vigorous competitive sports, such as football, basketball, and soccer. Jumping rope. Aerobic dancing. What are some everyday activities that can help me get exercise? Yard work, such as: Pushing a lawn mower. Raking and bagging leaves. Washing your car. Pushing a stroller. Shoveling snow. Gardening. Washing windows or floors. How can I be more active in my day-to-day activities? Use stairs instead of an elevator. Take a walk during your lunch break. If you drive, park your car farther away from your work or school. If you take public transportation, get off one stop early and walk the rest of the way. Stand up or walk around during all of your indoor phone calls. Get up, stretch, and walk around every 30 minutes throughout the day. Enjoy exercise with a friend. Support to continue exercising will help you keep a regular routine of activity. Where to find more information You can find more information about exercising to stay healthy from: U.S. Department of Health and Human Services: www.hhs.gov Centers for Disease Control and Prevention (  CDC): www.cdc.gov Summary Exercising regularly is  important. It will improve your overall fitness, flexibility, and endurance. Regular exercise will also improve your overall health. It can help you control your weight, reduce stress, and improve your bone density. Do not exercise so much that you hurt yourself, feel dizzy, or get very short of breath. Before you start a new exercise program, talk with your health care provider. This information is not intended to replace advice given to you by your health care provider. Make sure you discuss any questions you have with your health care provider. Document Revised: 10/12/2020 Document Reviewed: 10/12/2020 Elsevier Patient Education  2023 Elsevier Inc.  

## 2022-05-02 NOTE — Progress Notes (Signed)
Subjective:    Patient ID: Gabriela Gardner, female    DOB: 1951-03-26, 71 y.o.   MRN: [id number]   Chief Complaint: medical management of chronic issues     HPI:  Gabriela Gardner is a 71 y.o. who identifies as a female who was assigned female at birth.   Social history: Lives with: husband Work history: retired   Scientist, forensic in today for follow up of the following chronic medical issues:  1. Primary hypertension No c/o chest pain, sob or headache. Does not check blood pressure at home.' BP Readings from Last 3 Encounters:  01/08/22 132/74  10/29/21 140/77  05/01/21 132/84     2. Pure hypercholesterolemia Doe stry to [facility] diet and stays very active. Lab Results  Component Value Date   CHOL 183 10/29/2021   HDL 47 10/29/2021   LDLCALC 88 10/29/2021   TRIG 291 (H) 10/29/2021   CHOLHDL 3.9 10/29/2021  The 10-year ASCVD risk score (Arnett DK, et al., 2019) is: 16.4%    3. Acquired hypothyroidism No issues that she is aware of. Lab Results  Component Value Date   TSH 1.570 10/29/2021     4. Gastroesophageal reflux disease without esophagitis Is on omeprazole daily and is doing well.  5. Primary insomnia Sleep okay. Sleeps about 7 hours a night. Takes unisom nightly  6. BMI 28.0-28.9,adult No recent weight changes Wt Readings from Last 3 Encounters:  05/02/22 160 lb (72.6 kg)  01/08/22 160 lb (72.6 kg)  10/29/21 162 lb (73.5 kg)   BMI Readings from Last 3 Encounters:  05/02/22 27.46 kg/m  01/08/22 27.46 kg/m  10/29/21 27.81 kg/m     New complaints: None today  Allergies  Allergen Reactions   Codeine Rash   Outpatient Encounter Medications as of 05/02/2022  Medication Sig   amLODipine (NORVASC) 5 MG tablet TAKE 1 TABLET DAILY   atorvastatin (LIPITOR) 40 MG tablet TAKE 1 TABLET DAILY   calcium carbonate (OS-CAL) 600 MG TABS Take 600 mg by mouth daily.   Cyanocobalamin (B-12 PO) Take by mouth.   cyclobenzaprine (FLEXERIL) 5 MG tablet Take 1 tablet (5 mg  total) by mouth 3 (three) times daily as needed for muscle spasms.   ferrous sulfate 325 (65 FE) MG EC tablet Take 1 tablet (325 mg total) by mouth 2 (two) times daily before a meal.   levothyroxine (SYNTHROID) 100 MCG tablet TAKE 1 TABLET DAILY   lisinopril-hydrochlorothiazide (ZESTORETIC) 20-12.5 MG tablet TAKE 2 TABLETS DAILY   meloxicam (MOBIC) 15 MG tablet Take 1 tablet (15 mg total) by mouth daily.   Multiple Vitamin (MULTIVITAMIN) tablet Take 1 tablet by mouth daily.   omeprazole (PRILOSEC) 40 MG capsule TAKE 1 CAPSULE DAILY   predniSONE (DELTASONE) 20 MG tablet 2 po at sametime daily for 5 days-   No facility-administered encounter medications on file as of 05/02/2022.    Past Surgical History:  Procedure Laterality Date   ABDOMINAL HYSTERECTOMY     SKIN LESION EXCISION      Family History  Adopted: Yes  Problem Relation Age of Onset   Turner syndrome Daughter    Asthma Son    Breast cancer Neg Hx       Controlled substance contract: n/a     Review of Systems  Constitutional:  Negative for diaphoresis.  Eyes:  Negative for pain.  Respiratory:  Negative for shortness of breath.   Cardiovascular:  Negative for chest pain, palpitations and leg swelling.  Gastrointestinal:  Negative for abdominal pain.  Endocrine: Negative for polydipsia.  Skin:  Negative for rash.  Neurological:  Negative for dizziness, weakness and headaches.  Hematological:  Does not bruise/bleed easily.  All other systems reviewed and are negative.      Objective:   Physical Exam Vitals and nursing note reviewed.  Constitutional:      General: She is not in acute distress.    Appearance: Normal appearance. She is well-developed.  HENT:     Head: Normocephalic.     Right Ear: Tympanic membrane normal.     Left Ear: Tympanic membrane normal.     Nose: Nose normal.     Mouth/Throat:     Mouth: Mucous membranes are moist.  Eyes:     Pupils: Pupils are equal, round, and reactive to  light.  Neck:     Vascular: No carotid bruit or JVD.  Cardiovascular:     Rate and Rhythm: Normal rate and regular rhythm.     Heart sounds: Normal heart sounds.  Pulmonary:     Effort: Pulmonary effort is normal. No respiratory distress.     Breath sounds: Normal breath sounds. No wheezing or rales.  Chest:     Chest wall: No tenderness.  Abdominal:     General: Bowel sounds are normal. There is no distension or abdominal bruit.     Palpations: Abdomen is soft. There is no hepatomegaly, splenomegaly, mass or pulsatile mass.     Tenderness: There is no abdominal tenderness.  Musculoskeletal:        General: Normal range of motion.     Cervical back: Normal range of motion and neck supple.  Lymphadenopathy:     Cervical: No cervical adenopathy.  Skin:    General: Skin is warm and dry.  Neurological:     Mental Status: She is alert and oriented to person, place, and time.     Deep Tendon Reflexes: Reflexes are normal and symmetric.  Psychiatric:        Behavior: Behavior normal.        Thought Content: Thought content normal.        Judgment: Judgment normal.     BP 126/79   Pulse 63   Temp 97.6 F (36.4 C) (Temporal)   Resp 20   Ht _0  (1.626 m)   Wt 160 lb (72.6 kg)   SpO2 98%   BMI 27.46 kg/m        Assessment & Plan:   Gabriela Gardner comes in today with chief complaint of No chief complaint on file.   Diagnosis and orders addressed:  1. Primary hypertension Low sodium diet - amLODipine (NORVASC) 5 MG tablet; Take 1 tablet (5 mg total) by mouth daily.  Dispense: 90 tablet; Refill: 1 - lisinopril-hydrochlorothiazide (ZESTORETIC) 20-12.5 MG tablet; Take 2 tablets by mouth daily.  Dispense: 180 tablet; Refill: 1 - CBC with Differential/Platelet - CMP14+EGFR  2. Pure hypercholesterolemia Low fat diet - atorvastatin (LIPITOR) 40 MG tablet; Take 1 tablet (40 mg total) by mouth daily.  Dispense: 90 tablet; Refill: 1 - Lipid panel  3. Acquired  hypothyroidism Labs oending - levothyroxine (SYNTHROID) 100 MCG tablet; Take 1 tablet (100 mcg total) by mouth daily.  Dispense: 90 tablet; Refill: 1 - Thyroid Panel With TSH  4. Gastroesophageal reflux disease without esophagitis Avoid spicy foods Do not eat 2 hours prior to bedtime - omeprazole (PRILOSEC) 40 MG capsule; Take 1 capsule (40 mg total) by mouth daily.  Dispense: 90 capsule; Refill: 1  5. Primary insomnia  Bedtime routine  6. BMI 28.0-28.9,adult Discussed diet and exercise for person with BMI >25 Will recheck weight in 3-6 months    Labs pending Health Maintenance reviewed Diet and exercise encouraged  Follow up plan: 6 months   Mary-Margaret Hassell Done, FNP

## 2022-05-03 LAB — LIPID PANEL
Chol/HDL Ratio: 3.8 ratio (ref 0.0–4.4)
Cholesterol, Total: 186 mg/dL (ref 100–199)
HDL: 49 mg/dL (ref >39)
LDL Chol Calc (NIH): 91 mg/dL (ref 0–99)
Triglycerides: 275 mg/dL — ABNORMAL HIGH (ref 0–149)
VLDL Cholesterol Cal: 46 mg/dL — ABNORMAL HIGH (ref 5–40)

## 2022-05-03 LAB — CMP14+EGFR
ALT: 51 IU/L — ABNORMAL HIGH (ref 0–32)
AST: 41 IU/L — ABNORMAL HIGH (ref 0–40)
Albumin/Globulin Ratio: 2 (ref 1.2–2.2)
Albumin: 4.9 g/dL — ABNORMAL HIGH (ref 3.8–4.8)
Alkaline Phosphatase: 73 IU/L (ref 44–121)
BUN/Creatinine Ratio: 12 (ref 12–28)
BUN: 12 mg/dL (ref 8–27)
Bilirubin Total: 0.6 mg/dL (ref 0.0–1.2)
CO2: 24 mmol/L (ref 20–29)
Calcium: 10.1 mg/dL (ref 8.7–10.3)
Chloride: 100 mmol/L (ref 96–106)
Creatinine, Ser: 0.98 mg/dL (ref 0.57–1.00)
Globulin, Total: 2.4 g/dL (ref 1.5–4.5)
Glucose: 110 mg/dL — ABNORMAL HIGH (ref 70–99)
Potassium: 4.5 mmol/L (ref 3.5–5.2)
Sodium: 141 mmol/L (ref 134–144)
Total Protein: 7.3 g/dL (ref 6.0–8.5)
eGFR: 61 mL/min/{1.73_m2} (ref >59)

## 2022-05-03 LAB — CBC WITH DIFFERENTIAL/PLATELET
Basophils Absolute: 0.1 10*3/uL (ref 0.0–0.2)
Basos: 1 %
EOS (ABSOLUTE): 0.2 10*3/uL (ref 0.0–0.4)
Eos: 3 %
Hematocrit: 42.1 % (ref 34.0–46.6)
Hemoglobin: 14.5 g/dL (ref 11.1–15.9)
Immature Grans (Abs): 0.1 10*3/uL (ref 0.0–0.1)
Immature Granulocytes: 1 %
Lymphocytes Absolute: 1.3 10*3/uL (ref 0.7–3.1)
Lymphs: 20 %
MCH: 32 pg (ref 26.6–33.0)
MCHC: 34.4 g/dL (ref 31.5–35.7)
MCV: 93 fL (ref 79–97)
Monocytes Absolute: 0.5 10*3/uL (ref 0.1–0.9)
Monocytes: 7 %
Neutrophils Absolute: 4.4 10*3/uL (ref 1.4–7.0)
Neutrophils: 68 %
Platelets: 251 10*3/uL (ref 150–450)
RBC: 4.53 x10E6/uL (ref 3.77–5.28)
RDW: 11.9 % (ref 11.7–15.4)
WBC: 6.5 10*3/uL (ref 3.4–10.8)

## 2022-05-03 LAB — THYROID PANEL WITH TSH
Free Thyroxine Index: 2 (ref 1.2–4.9)
T3 Uptake Ratio: 23 % — ABNORMAL LOW (ref 24–39)
T4, Total: 8.5 ug/dL (ref 4.5–12.0)
TSH: 2.55 u[IU]/mL (ref 0.450–4.500)

## 2022-07-18 ENCOUNTER — Other Ambulatory Visit: Payer: Self-pay | Admitting: *Deleted

## 2022-07-18 DIAGNOSIS — I10 Essential (primary) hypertension: Secondary | ICD-10-CM

## 2022-07-18 DIAGNOSIS — E7800 Pure hypercholesterolemia, unspecified: Secondary | ICD-10-CM

## 2022-07-18 DIAGNOSIS — K219 Gastro-esophageal reflux disease without esophagitis: Secondary | ICD-10-CM

## 2022-07-18 DIAGNOSIS — M1990 Unspecified osteoarthritis, unspecified site: Secondary | ICD-10-CM

## 2022-07-18 MED ORDER — MELOXICAM 15 MG PO TABS: 15.0000 mg | ORAL_TABLET | Freq: Every day | ORAL | 0 refills | Status: DC

## 2022-08-19 ENCOUNTER — Telehealth: Payer: Self-pay | Admitting: Nurse Practitioner

## 2022-08-19 DIAGNOSIS — M1990 Unspecified osteoarthritis, unspecified site: Secondary | ICD-10-CM

## 2022-08-19 DIAGNOSIS — E039 Hypothyroidism, unspecified: Secondary | ICD-10-CM

## 2022-08-19 DIAGNOSIS — K219 Gastro-esophageal reflux disease without esophagitis: Secondary | ICD-10-CM

## 2022-08-19 DIAGNOSIS — I10 Essential (primary) hypertension: Secondary | ICD-10-CM

## 2022-08-19 DIAGNOSIS — E7800 Pure hypercholesterolemia, unspecified: Secondary | ICD-10-CM

## 2022-08-19 MED ORDER — LEVOTHYROXINE SODIUM 100 MCG PO TABS: 100.0000 ug | ORAL_TABLET | Freq: Every day | ORAL | 0 refills | Status: DC

## 2022-08-19 MED ORDER — LISINOPRIL-HYDROCHLOROTHIAZIDE 20-12.5 MG PO TABS: 2.0000 | ORAL_TABLET | Freq: Every day | ORAL | 0 refills | Status: DC

## 2022-08-19 MED ORDER — ATORVASTATIN CALCIUM 40 MG PO TABS: 40.0000 mg | ORAL_TABLET | Freq: Every day | ORAL | 0 refills | Status: DC

## 2022-08-19 MED ORDER — MELOXICAM 15 MG PO TABS: 15.0000 mg | ORAL_TABLET | Freq: Every day | ORAL | 0 refills | Status: DC

## 2022-08-19 MED ORDER — OMEPRAZOLE 40 MG PO CPDR: 40.0000 mg | DELAYED_RELEASE_CAPSULE | Freq: Every day | ORAL | 0 refills | Status: DC

## 2022-08-19 NOTE — Telephone Encounter (Signed)
TC to [facility], RFs on her Amlodipine, Atorvastatin, Zestoretic & Omeprazole were not received by pharmacy back in November, sent those in as well as Meloxicam & Levothyroxine. Next OV 10/31/22. Pt aware

## 2022-08-19 NOTE — Telephone Encounter (Signed)
  Prescription Request  08/19/2022  Is this a "Controlled Substance" medicine?  omeprazole (PRILOSEC) 40 MG capsule  lisinopril-hydrochlorothiazide (ZESTORETIC) 20-12.5 MG tablet  levothyroxine (SYNTHROID) 100 MCG tablet  meloxicam (MOBIC) 15 MG tablet  atorvastatin (LIPITOR) 40 MG tablet   Have you seen your PCP in the last 2 weeks? Upcoming apt 10/31/2022   If YES, route message to pool  -  If NO, patient needs to be scheduled for appointment.  What is the name of the medication or equipment? omeprazole (PRILOSEC) 40 MG capsule  lisinopril-hydrochlorothiazide (ZESTORETIC) 20-12.5 MG tablet  levothyroxine (SYNTHROID) 100 MCG tablet  meloxicam (MOBIC) 15 MG tablet  atorvastatin (LIPITOR) 40 MG tablet   Have you contacted your pharmacy to request a refill? Yes but no answer from office. Pt was told that pharmacy needs more information from the dr   Which pharmacy would you like this sent to? [facility] mail order   Patient notified that their request is being sent to the clinical staff for review and that they should receive a response within 2 business days.

## 2022-10-30 ENCOUNTER — Telehealth: Payer: Self-pay | Admitting: Nurse Practitioner

## 2022-10-30 NOTE — Telephone Encounter (Signed)
Contacted [name] to schedule their annual wellness visit. Appointment made for 11/04/2022.  [facility] Care Guide [facility] AWV TEAM Direct Dial: [phone]

## 2022-10-31 ENCOUNTER — Encounter: Payer: Self-pay | Admitting: Nurse Practitioner

## 2022-10-31 ENCOUNTER — Ambulatory Visit (INDEPENDENT_AMBULATORY_CARE_PROVIDER_SITE_OTHER): Payer: No Typology Code available for payment source | Admitting: Nurse Practitioner

## 2022-10-31 ENCOUNTER — Ambulatory Visit (INDEPENDENT_AMBULATORY_CARE_PROVIDER_SITE_OTHER): Payer: No Typology Code available for payment source

## 2022-10-31 VITALS — BP 134/79 | HR 60 | Temp 97.0°F | Resp 20 | Ht 64.0 in | Wt 161.0 lb

## 2022-10-31 DIAGNOSIS — D75839 Thrombocytosis, unspecified: Secondary | ICD-10-CM

## 2022-10-31 DIAGNOSIS — F5101 Primary insomnia: Secondary | ICD-10-CM

## 2022-10-31 DIAGNOSIS — Z780 Asymptomatic menopausal state: Secondary | ICD-10-CM | POA: Diagnosis not present

## 2022-10-31 DIAGNOSIS — E039 Hypothyroidism, unspecified: Secondary | ICD-10-CM

## 2022-10-31 DIAGNOSIS — Z6828 Body mass index (BMI) 28.0-28.9, adult: Secondary | ICD-10-CM | POA: Diagnosis not present

## 2022-10-31 DIAGNOSIS — K219 Gastro-esophageal reflux disease without esophagitis: Secondary | ICD-10-CM | POA: Diagnosis not present

## 2022-10-31 DIAGNOSIS — E7800 Pure hypercholesterolemia, unspecified: Secondary | ICD-10-CM

## 2022-10-31 DIAGNOSIS — M1990 Unspecified osteoarthritis, unspecified site: Secondary | ICD-10-CM | POA: Diagnosis not present

## 2022-10-31 DIAGNOSIS — M8589 Other specified disorders of bone density and structure, multiple sites: Secondary | ICD-10-CM | POA: Diagnosis not present

## 2022-10-31 DIAGNOSIS — I10 Essential (primary) hypertension: Secondary | ICD-10-CM

## 2022-10-31 LAB — CMP14+EGFR
ALT: 51 IU/L — ABNORMAL HIGH (ref 0–32)
AST: 41 IU/L — ABNORMAL HIGH (ref 0–40)
Albumin/Globulin Ratio: 1.9 (ref 1.2–2.2)
Alkaline Phosphatase: 73 IU/L (ref 44–121)
CO2: 23 mmol/L (ref 20–29)
Glucose: 131 mg/dL — ABNORMAL HIGH (ref 70–99)
Total Protein: 7.3 g/dL (ref 6.0–8.5)
eGFR: 56 mL/min/{1.73_m2} — ABNORMAL LOW (ref >59)

## 2022-10-31 LAB — THYROID PANEL WITH TSH

## 2022-10-31 LAB — CBC WITH DIFFERENTIAL/PLATELET
Eos: 5 %
Immature Grans (Abs): 0 10*3/uL (ref 0.0–0.1)
Lymphocytes Absolute: 1.2 10*3/uL (ref 0.7–3.1)
MCH: 31.5 pg (ref 26.6–33.0)
MCHC: 33.1 g/dL (ref 31.5–35.7)
Monocytes: 8 %
Neutrophils Absolute: 3.4 10*3/uL (ref 1.4–7.0)
Neutrophils: 63 %
RDW: 12 % (ref 11.7–15.4)
WBC: 5.3 10*3/uL (ref 3.4–10.8)

## 2022-10-31 LAB — LIPID PANEL: Triglycerides: 264 mg/dL — ABNORMAL HIGH (ref 0–149)

## 2022-10-31 MED ORDER — FERROUS SULFATE 325 (65 FE) MG PO TBEC: 325.0000 mg | DELAYED_RELEASE_TABLET | Freq: Two times a day (BID) | ORAL | 1 refills | Status: DC

## 2022-10-31 MED ORDER — ATORVASTATIN CALCIUM 40 MG PO TABS: 40.0000 mg | ORAL_TABLET | Freq: Every day | ORAL | 1 refills | Status: DC

## 2022-10-31 MED ORDER — OMEPRAZOLE 40 MG PO CPDR
40.0000 mg | DELAYED_RELEASE_CAPSULE | Freq: Every day | ORAL | 1 refills | Status: DC
Start: 2022-10-31 — End: 2023-05-04

## 2022-10-31 MED ORDER — MELOXICAM 15 MG PO TABS: 15.0000 mg | ORAL_TABLET | Freq: Every day | ORAL | 1 refills | Status: DC

## 2022-10-31 MED ORDER — AMLODIPINE BESYLATE 5 MG PO TABS: 5.0000 mg | ORAL_TABLET | Freq: Every day | ORAL | 1 refills | Status: DC

## 2022-10-31 MED ORDER — LEVOTHYROXINE SODIUM 100 MCG PO TABS: 100.0000 ug | ORAL_TABLET | Freq: Every day | ORAL | 1 refills | Status: DC

## 2022-10-31 MED ORDER — LISINOPRIL-HYDROCHLOROTHIAZIDE 20-12.5 MG PO TABS: 2.0000 | ORAL_TABLET | Freq: Every day | ORAL | 1 refills | Status: DC

## 2022-10-31 NOTE — Progress Notes (Signed)
Subjective:    Patient ID: Gabriela Gardner, female    DOB: 08/27/1950, 72 y.o.   MRN: [id number]   Chief Complaint: medical management of chronic issues     HPI:  Gabriela Gardner is a 72 y.o. who identifies as a female who was assigned female at birth.   Social history: Lives with: husband Work history: retired   Water engineer in today for follow up of the following chronic medical issues:  1. Primary hypertension No c/o chest pain, sob or headache. Does not check blood pressure at home. BP Readings from Last 3 Encounters:  05/02/22 126/79  01/08/22 132/74  10/29/21 140/77     2. Pure hypercholesterolemia Does watch diet and has not been exercising like she use to. Lab Results  Component Value Date   CHOL 186 05/02/2022   HDL 49 05/02/2022   LDLCALC 91 05/02/2022   TRIG 275 (H) 05/02/2022   CHOLHDL 3.8 05/02/2022   The 10-year ASCVD risk score (Arnett DK, et al., 2019) is: 16.8%   3. Acquired hypothyroidism No issues that she is aware of. Lab Results  Component Value Date   TSH 2.550 05/02/2022     4. Gastroesophageal reflux disease without esophagitis Takes omeprazole as needed  5. Thrombocytosis Lab Results  Component Value Date   WBC 6.5 05/02/2022   HGB 14.5 05/02/2022   HCT 42.1 05/02/2022   MCV 93 05/02/2022   PLT 251 05/02/2022     6. Primary insomnia No issues currently  7. BMI 28.0-28.9,adult No recent weight changes Wt Readings from Last 3 Encounters:  10/31/22 161 lb (73 kg)  05/02/22 160 lb (72.6 kg)  01/08/22 160 lb (72.6 kg)   BMI Readings from Last 3 Encounters:  10/31/22 27.64 kg/m  05/02/22 27.46 kg/m  01/08/22 27.46 kg/m     New complaints: None today  Allergies  Allergen Reactions   Codeine Rash   Outpatient Encounter Medications as of 10/31/2022  Medication Sig   amLODipine (NORVASC) 5 MG tablet Take 1 tablet (5 mg total) by mouth daily.   Ascorbic Acid (VITAMIN C PO) Take by mouth.   atorvastatin (LIPITOR) 40 MG  tablet Take 1 tablet (40 mg total) by mouth daily.   calcium carbonate (OS-CAL) 600 MG TABS Take 600 mg by mouth daily.   Cyanocobalamin (B-12 PO) Take by mouth.   ferrous sulfate 325 (65 FE) MG EC tablet Take 1 tablet (325 mg total) by mouth 2 (two) times daily before a meal.   levothyroxine (SYNTHROID) 100 MCG tablet Take 1 tablet (100 mcg total) by mouth daily.   lisinopril-hydrochlorothiazide (ZESTORETIC) 20-12.5 MG tablet Take 2 tablets by mouth daily.   meloxicam (MOBIC) 15 MG tablet Take 1 tablet (15 mg total) by mouth daily.   Multiple Vitamin (MULTIVITAMIN) tablet Take 1 tablet by mouth daily.   omeprazole (PRILOSEC) 40 MG capsule Take 1 capsule (40 mg total) by mouth daily.   No facility-administered encounter medications on file as of 10/31/2022.    Past Surgical History:  Procedure Laterality Date   ABDOMINAL HYSTERECTOMY     SKIN LESION EXCISION      Family History  Adopted: Yes  Problem Relation Age of Onset   Turner syndrome Daughter    Asthma Son    Breast cancer Neg Hx       Controlled substance contract: n/a     Review of Systems  Constitutional:  Negative for diaphoresis.  Eyes:  Negative for pain.  Respiratory:  Negative for shortness of  breath.   Cardiovascular:  Negative for chest pain, palpitations and leg swelling.  Gastrointestinal:  Negative for abdominal pain.  Endocrine: Negative for polydipsia.  Skin:  Negative for rash.  Neurological:  Negative for dizziness, weakness and headaches.  Hematological:  Does not bruise/bleed easily.  All other systems reviewed and are negative.      Objective:   Physical Exam Vitals and nursing note reviewed.  Constitutional:      General: She is not in acute distress.    Appearance: Normal appearance. She is well-developed.  HENT:     Head: Normocephalic.     Right Ear: Tympanic membrane normal.     Left Ear: Tympanic membrane normal.     Nose: Nose normal.     Mouth/Throat:     Mouth: Mucous  membranes are moist.  Eyes:     Pupils: Pupils are equal, round, and reactive to light.  Neck:     Vascular: No carotid bruit or JVD.  Cardiovascular:     Rate and Rhythm: Normal rate and regular rhythm.     Heart sounds: Normal heart sounds.  Pulmonary:     Effort: Pulmonary effort is normal. No respiratory distress.     Breath sounds: Normal breath sounds. No wheezing or rales.  Chest:     Chest wall: No tenderness.  Abdominal:     General: Bowel sounds are normal. There is no distension or abdominal bruit.     Palpations: Abdomen is soft. There is no hepatomegaly, splenomegaly, mass or pulsatile mass.     Tenderness: There is no abdominal tenderness.  Musculoskeletal:        General: Normal range of motion.     Cervical back: Normal range of motion and neck supple.  Lymphadenopathy:     Cervical: No cervical adenopathy.  Skin:    General: Skin is warm and dry.  Neurological:     Mental Status: She is alert and oriented to person, place, and time.     Deep Tendon Reflexes: Reflexes are normal and symmetric.  Psychiatric:        Behavior: Behavior normal.        Thought Content: Thought content normal.        Judgment: Judgment normal.     BP 134/79   Pulse 60   Temp (!) 97 F (36.1 C) (Temporal)   Resp 20   Ht 5\' 4"  (1.626 m)   Wt 161 lb (73 kg)   SpO2 97%   BMI 27.64 kg/m        Assessment & Plan:   Gabriela Gardner comes in today with chief complaint of Medical Management of Chronic Issues   Diagnosis and orders addressed:  1. Primary hypertension Low sodium diet - amLODipine (NORVASC) 5 MG tablet; Take 1 tablet (5 mg total) by mouth daily.  Dispense: 90 tablet; Refill: 1 - lisinopril-hydrochlorothiazide (ZESTORETIC) 20-12.5 MG tablet; Take 2 tablets by mouth daily.  Dispense: 180 tablet; Refill: 1 - CBC with Differential/Platelet - CMP14+EGFR  2. Pure hypercholesterolemia Low fat diet - atorvastatin (LIPITOR) 40 MG tablet; Take 1 tablet (40 mg total)  by mouth daily.  Dispense: 90 tablet; Refill: 1 - Lipid panel  3. Acquired hypothyroidism Labs pending - levothyroxine (SYNTHROID) 100 MCG tablet; Take 1 tablet (100 mcg total) by mouth daily.  Dispense: 90 tablet; Refill: 1 - Thyroid Panel With TSH  4. Gastroesophageal reflux disease without esophagitis Avoid spicy foods Do not eat 2 hours prior to bedtime - omeprazole (PRILOSEC)  40 MG capsule; Take 1 capsule (40 mg total) by mouth daily.  Dispense: 90 capsule; Refill: 1  5. Thrombocytosis Labs pending - ferrous sulfate 325 (65 FE) MG EC tablet; Take 1 tablet (325 mg total) by mouth 2 (two) times daily before a meal.  Dispense: 90 tablet; Refill: 1  6. Primary insomnia Bedtime routine  7. BMI 28.0-28.9,adult Discussed diet and exercise for person with BMI >25 Will recheck weight in 3-6 months   8. Arthritis - meloxicam (MOBIC) 15 MG tablet; Take 1 tablet (15 mg total) by mouth daily.  Dispense: 90 tablet; Refill: 1   Labs pending Health Maintenance reviewed Diet and exercise encouraged  Follow up plan: 6 months   Mary-Margaret Daphine Deutscher, FNP

## 2022-10-31 NOTE — Patient Instructions (Signed)
Cooking With Less Salt Cooking with less salt is one way to reduce the amount of sodium you get from food. Sodium is one of the elements that make up salt. It is found naturally in foods and is also added to certain foods. Depending on your condition and overall health, your health care provider or dietitian may recommend that you reduce your sodium intake. Most people should have less than 2,300 milligrams (mg) of sodium each day. If you have high blood pressure (hypertension), you may need to limit your sodium to 1,500 mg each day. Follow the tips below to help reduce your sodium intake. What are tips for eating less sodium? Reading food labels  Check the food label before buying or using packaged ingredients. Always check the label for the serving size and sodium content. Look for products with no more than 140 mg of sodium in one serving. Check the % Daily Value column to see what percent of the daily recommended amount of sodium is provided in one serving of the product. Foods with 5% or less in this column are considered low in sodium. Foods with 20% or higher are considered high in sodium. Do not choose foods with salt as one of the first three ingredients on the ingredients list. If salt is one of the first three ingredients, it usually means the item is high in sodium. Shopping Buy sodium-free or low-sodium products. Look for the following words on food labels: Low-sodium. Sodium-free. Reduced-sodium. No salt added. Unsalted. Always check the sodium content even if foods are labeled as low-sodium or no salt added. Buy fresh foods. Cooking Use herbs, seasonings without salt, and spices as substitutes for salt. Use sodium-free baking soda when baking. Grill, braise, or roast foods to add flavor with less salt. Avoid adding salt to pasta, rice, or hot cereals. Drain and rinse canned vegetables, beans, and meat before use. Avoid adding salt when cooking sweets and desserts. Cook with  low-sodium ingredients. What foods are high in sodium? Vegetables Regular canned vegetables (not low-sodium or reduced-sodium). Sauerkraut, pickled vegetables, and relishes. Olives. French fries. Onion rings. Regular canned tomato sauce and paste. Regular tomato and vegetable juice. Frozen vegetables in sauces. Grains Instant hot cereals. Bread stuffing, pancake, and biscuit mixes. Croutons. Seasoned rice or pasta mixes. Noodle soup cups. Boxed or frozen macaroni and cheese. Regular salted crackers. Self-rising flour. Rolls. Bagels. Flour tortillas and wraps. Meats and other proteins Meat or fish that is salted, canned, smoked, cured, spiced, or pickled. This includes bacon, ham, sausages, hot dogs, corned beef, chipped beef, meat loaves, salt pork, jerky, pickled herring, anchovies, regular canned tuna, and sardines. Salted nuts. Dairy Processed cheese and cheese spreads. Cheese curds. Blue cheese. Feta cheese. String cheese. Regular cottage cheese. Buttermilk. Canned milk. The items listed above may not be a complete list of foods high in sodium. Actual amounts of sodium may be different depending on processing. Contact a dietitian for more information. What foods are low in sodium? Fruits Fresh, frozen, or canned fruit with no sauce added. Fruit juice. Vegetables Fresh or frozen vegetables with no sauce added. "No salt added" canned vegetables. "No salt added" tomato sauce and paste. Low-sodium or reduced-sodium tomato and vegetable juice. Grains Noodles, pasta, quinoa, rice. Shredded or puffed wheat or puffed rice. Regular or quick oats (not instant). Low-sodium crackers. Low-sodium bread. Whole-grain bread and whole-grain pasta. Unsalted popcorn. Meats and other proteins Fresh or frozen whole meats, poultry (not injected with sodium), and fish with no sauce added.   Unsalted nuts. Dried peas, beans, and lentils without added salt. Unsalted canned beans. Eggs. Unsalted nut butters. Low-sodium  canned tuna or chicken. Dairy Milk. Soy milk. Yogurt. Low-sodium cheeses, such as Swiss, Monterey Jack, mozzarella, and ricotta. Sherbet or ice cream (keep to  cup per serving). Cream cheese. Fats and oils Unsalted butter or margarine. Other foods Homemade pudding. Sodium-free baking soda and baking powder. Herbs and spices. Low-sodium seasoning mixes. Beverages Coffee and tea. Carbonated beverages. The items listed above may not be a complete list of foods low in sodium. Actual amounts of sodium may be different depending on processing. Contact a dietitian for more information. What are some salt alternatives when cooking? The following are herbs, seasonings, and spices that can be used instead of salt to flavor your food. Herbs should be fresh or dried. Do not choose packaged mixes. Next to the name of the herb, spice, or seasoning are some examples of foods you can pair it with. Herbs Bay leaves - Soups, meat and vegetable dishes, and spaghetti sauce. Basil - Italian dishes, soups, pasta, and fish dishes. Cilantro - Meat, poultry, and vegetable dishes. Chili powder - Marinades and Mexican dishes. Chives - Salad dressings and potato dishes. Cumin - Mexican dishes, couscous, and meat dishes. Dill - Fish dishes, sauces, and salads. Fennel - Meat and vegetable dishes, breads, and cookies. Garlic (do not use garlic salt) - Italian dishes, meat dishes, salad dressings, and sauces. Marjoram - Soups, potato dishes, and meat dishes. Oregano - Pizza and spaghetti sauce. Parsley - Salads, soups, pasta, and meat dishes. Rosemary - Italian dishes, salad dressings, soups, and red meats. 

## 2022-10-31 NOTE — Addendum Note (Signed)
Addended by: Leda Min D on: 10/31/2022 08:23 AM   Modules accepted: Orders

## 2022-11-01 LAB — LIPID PANEL
Chol/HDL Ratio: 4 ratio (ref 0.0–4.4)
HDL: 53 mg/dL (ref >39)
LDL Chol Calc (NIH): 112 mg/dL — ABNORMAL HIGH (ref 0–99)

## 2022-11-01 LAB — CBC WITH DIFFERENTIAL/PLATELET
Basophils Absolute: 0.1 10*3/uL (ref 0.0–0.2)
Basos: 1 %
EOS (ABSOLUTE): 0.3 10*3/uL (ref 0.0–0.4)
Hematocrit: 44.7 % (ref 34.0–46.6)
Hemoglobin: 14.8 g/dL (ref 11.1–15.9)
Immature Granulocytes: 0 %
Lymphs: 23 %
MCV: 95 fL (ref 79–97)
Monocytes Absolute: 0.5 10*3/uL (ref 0.1–0.9)
Platelets: 249 10*3/uL (ref 150–450)
RBC: 4.7 x10E6/uL (ref 3.77–5.28)

## 2022-11-01 LAB — THYROID PANEL WITH TSH: T3 Uptake Ratio: 24 % (ref 24–39)

## 2022-11-01 LAB — CMP14+EGFR
Albumin: 4.8 g/dL (ref 3.8–4.8)
BUN/Creatinine Ratio: 16 (ref 12–28)
BUN: 17 mg/dL (ref 8–27)
Bilirubin Total: 0.7 mg/dL (ref 0.0–1.2)
Calcium: 10.4 mg/dL — ABNORMAL HIGH (ref 8.7–10.3)
Chloride: 100 mmol/L (ref 96–106)
Creatinine, Ser: 1.05 mg/dL — ABNORMAL HIGH (ref 0.57–1.00)
Globulin, Total: 2.5 g/dL (ref 1.5–4.5)
Potassium: 5.1 mmol/L (ref 3.5–5.2)
Sodium: 140 mmol/L (ref 134–144)

## 2022-11-04 ENCOUNTER — Ambulatory Visit: Payer: No Typology Code available for payment source

## 2022-11-04 VITALS — Ht 64.0 in | Wt 160.0 lb

## 2022-11-04 DIAGNOSIS — Z0000 Encounter for general adult medical examination without abnormal findings: Secondary | ICD-10-CM | POA: Diagnosis not present

## 2022-11-04 NOTE — Progress Notes (Signed)
Subjective:   Gabriela Gardner is a 72 y.o. female who presents for Medicare Annual (Subsequent) preventive examination. I connected with  Gabriela Gardner on 11/04/22 by a audio enabled telemedicine application and verified that I am speaking with the correct person using two identifiers.  Patient Location: Home  Provider Location: Home Office  I discussed the limitations of evaluation and management by telemedicine. The patient expressed understanding and agreed to proceed.  Review of Systems     Cardiac Risk Factors include: advanced age (>56men, >78 women);hypertension;dyslipidemia     Objective:    Today's Vitals   11/04/22 1426  Weight: 160 lb (72.6 kg)  Height: 5\' 4"  (1.626 m)   Body mass index is 27.46 kg/m.     11/04/2022    2:28 PM 04/11/2022    1:58 PM 03/11/2021    2:19 PM 09/16/2019    8:51 AM 11/11/2017    1:56 PM 09/29/2017    1:48 PM  Advanced Directives  Does Patient Have a Medical Advance Directive? No No No No No No  Would patient like information on creating a medical advance directive? No - Patient declined No - Patient declined No - Patient declined No - Patient declined No - Patient declined No - Patient declined    Current Medications (verified) Outpatient Encounter Medications as of 11/04/2022  Medication Sig   amLODipine (NORVASC) 5 MG tablet Take 1 tablet (5 mg total) by mouth daily.   Ascorbic Acid (VITAMIN C PO) Take by mouth.   atorvastatin (LIPITOR) 40 MG tablet Take 1 tablet (40 mg total) by mouth daily.   calcium carbonate (OS-CAL) 600 MG TABS Take 600 mg by mouth daily.   Cyanocobalamin (B-12 PO) Take by mouth.   ferrous sulfate 325 (65 FE) MG EC tablet Take 1 tablet (325 mg total) by mouth 2 (two) times daily before a meal.   levothyroxine (SYNTHROID) 100 MCG tablet Take 1 tablet (100 mcg total) by mouth daily.   lisinopril-hydrochlorothiazide (ZESTORETIC) 20-12.5 MG tablet Take 2 tablets by mouth daily.   meloxicam (MOBIC) 15 MG tablet Take 1  tablet (15 mg total) by mouth daily.   Multiple Vitamin (MULTIVITAMIN) tablet Take 1 tablet by mouth daily.   omeprazole (PRILOSEC) 40 MG capsule Take 1 capsule (40 mg total) by mouth daily.   No facility-administered encounter medications on file as of 11/04/2022.    Allergies (verified) Codeine   History: Past Medical History:  Diagnosis Date   Hyperlipidemia    Hypertension    Skin cancer (melanoma) (HCC)    left upper arm   Thyroid disease    Past Surgical History:  Procedure Laterality Date   ABDOMINAL HYSTERECTOMY     SKIN LESION EXCISION     Family History  Adopted: Yes  Problem Relation Age of Onset   Turner syndrome Daughter    Asthma Son    Breast cancer Neg Hx    Social History   Socioeconomic History   Marital status: Married    Spouse name: Molly Maduro   Number of children: 2   Years of education: 12   Highest education level: 12th grade  Occupational History   Occupation: retired  Tobacco Use   Smoking status: Former    Packs/day: 1.00    Years: 30.00    Additional pack years: 0.00    Total pack years: 30.00    Types: Cigarettes    Quit date: 06/30/2006    Years since quitting: 16.3   Smokeless tobacco: Never  Vaping Use   Vaping Use: Never used  Substance and Sexual Activity   Alcohol use: Yes    Comment: occasional   Drug use: No   Sexual activity: Yes    Birth control/protection: Post-menopausal  Other Topics Concern   Not on file  Social History Narrative   Not on file   Social Determinants of Health   Financial Resource Strain: Low Risk  (11/04/2022)   Overall Financial Resource Strain (CARDIA)    Difficulty of Paying Living Expenses: Not hard at all  Food Insecurity: No Food Insecurity (11/04/2022)   Hunger Vital Sign    Worried About Running Out of Food in the Last Year: Never true    Ran Out of Food in the Last Year: Never true  Transportation Needs: No Transportation Needs (11/04/2022)   PRAPARE - Scientist, research (physical sciences) (Medical): No    Lack of Transportation (Non-Medical): No  Physical Activity: Insufficiently Active (11/04/2022)   Exercise Vital Sign    Days of Exercise per Week: 3 days    Minutes of Exercise per Session: 30 min  Stress: No Stress Concern Present (11/04/2022)   [employer] of Occupational Health - Occupational Stress Questionnaire    Feeling of Stress : Not at all  Social Connections: Moderately Integrated (11/04/2022)   Social Connection and Isolation Panel [NHANES]    Frequency of Communication with Friends and Family: More than three times a week    Frequency of Social Gatherings with Friends and Family: More than three times a week    Attends Religious Services: More than 4 times per year    Active Member of [employer] or Organizations: No    Attends Engineer, structural: Never    Marital Status: Married    Tobacco Counseling Counseling given: Not Answered   Clinical Intake:  Pre-visit preparation completed: Yes  Pain : No/denies pain     Nutritional Risks: None Diabetes: No  How often do you need to have someone help you when you read instructions, pamphlets, or other written materials from your doctor or pharmacy?: 1 - Never  Diabetic?no   Interpreter Needed?: No  Information entered by :: Renie Ora, LPN   Activities of Daily Living    11/04/2022    2:29 PM 11/03/2022   10:12 PM  In your present state of health, do you have any difficulty performing the following activities:  Hearing? 0 0  Vision? 0 0  Difficulty concentrating or making decisions? 0 0  Walking or climbing stairs? 0 0  Dressing or bathing? 0 0  Doing errands, shopping? 0 0  Preparing Food and eating ? N N  Using the Toilet? N N  In the past six months, have you accidently leaked urine? N Y  Do you have problems with loss of bowel control? N N  Managing your Medications? N N  Managing your Finances? N N  Housekeeping or managing your Housekeeping? N N    Patient  Care Team: Bennie Pierini, FNP as PCP - General (Nurse Practitioner)  Indicate any recent Medical Services you may have received from other than [facility] providers in the past year (date may be approximate).     Assessment:   This is a routine wellness examination for [place].  Hearing/Vision screen Vision Screening - Comments:: Wears rx glasses - up to date with routine eye exams with  Dr. Nedra Hai  Dietary issues and exercise activities discussed: Current Exercise Habits: Home exercise routine, Type of exercise:  walking, Time (Minutes): 30, Frequency (Times/Week): 3, Weekly Exercise (Minutes/Week): 90, Intensity: Mild, Exercise limited by: None identified   Goals Addressed             This Visit's Progress    DIET - INCREASE WATER INTAKE   On track    Try to drink 6-8 glasses of water daily       Depression Screen    11/04/2022    2:28 PM 10/31/2022    8:03 AM 05/02/2022    8:26 AM 04/11/2022    1:58 PM 01/08/2022    8:24 AM 10/29/2021    8:42 AM 05/01/2021    8:44 AM  PHQ 2/9 Scores  PHQ - 2 Score 0 0 0 0 0 0 0  PHQ- 9 Score 0 0 0  0 1 1    Fall Risk    11/04/2022    2:27 PM 11/03/2022   10:12 PM 10/31/2022    8:03 AM 05/02/2022    8:26 AM 04/11/2022    1:58 PM  Fall Risk   Falls in the past year? 0 0 0 0 0  Number falls in past yr: 0      Injury with Fall? 0      Risk for fall due to : No Fall Risks      Follow up Falls prevention discussed        FALL RISK PREVENTION PERTAINING TO THE HOME:  Any stairs in or around the home? Yes  If so, are there any without handrails? No  Home free of loose throw rugs in walkways, pet beds, electrical cords, etc? Yes  Adequate lighting in your home to reduce risk of falls? Yes   ASSISTIVE DEVICES UTILIZED TO PREVENT FALLS:  Life alert? No  Use of a cane, walker or w/c? No  Grab bars in the bathroom? Yes  Shower chair or bench in shower? No  Elevated toilet seat or a handicapped toilet? No          11/04/2022    2:29 PM  04/11/2022    1:59 PM 09/16/2019    8:54 AM  6CIT Screen  What Year? 0 points 0 points 0 points  What month? 0 points 0 points 0 points  What time? 0 points 0 points 0 points  Count back from 20 0 points 0 points 0 points  Months in reverse 0 points 0 points 0 points  Repeat phrase 0 points 0 points 0 points  Total Score 0 points 0 points 0 points    Immunizations Immunization History  Administered Date(s) Administered   Fluad Quad(high Dose 65+) 03/24/2021   Influenza, High Dose Seasonal PF 04/01/2016   Influenza,inj,Quad PF,6+ Mos 09/07/2014, 04/11/2015   Influenza-Unspecified 03/13/2017, 03/01/2019, 03/29/2020, 03/14/2022   Moderna SARS-COV2 Booster Vaccination 05/03/2020, 01/28/2021, 04/17/2021, 03/20/2022   Moderna Sars-Covid-2 Vaccination 07/23/2019, 08/20/2019   Pneumococcal Conjugate-13 12/27/2014   Pneumococcal Polysaccharide-23 09/19/2016   Tdap 10/29/2021   Zoster Recombinat (Shingrix) 10/29/2021, 05/02/2022   Zoster, Live 02/22/2014    TDAP status: Up to date  Flu Vaccine status: Up to date  Pneumococcal vaccine status: Up to date  Covid-19 vaccine status: Completed vaccines  Qualifies for Shingles Vaccine? Yes   Zostavax completed Yes   Shingrix Completed?: Yes  Screening Tests Health Maintenance  Topic Date Due   COVID-19 Vaccine (7 - 2023-24 season) 11/16/2022 (Originally 05/15/2022)   COLONOSCOPY (Pts 45-14yrs Insurance coverage will need to be confirmed)  01/19/2023   INFLUENZA VACCINE  01/29/2023  MAMMOGRAM  03/20/2023   Medicare Annual Wellness (AWV)  11/04/2023   DEXA SCAN  10/30/2024   DTaP/Tdap/Td (2 - Td or Tdap) 10/30/2031   Pneumonia Vaccine 63+ Years old  Completed   Hepatitis C Screening  Completed   Zoster Vaccines- Shingrix  Completed   HPV VACCINES  Aged Out    Health Maintenance  There are no preventive care reminders to display for this patient.  Colorectal cancer screening: Type of screening: Colonoscopy. Completed  01/18/2013. Repeat every 10 years  Mammogram status: Completed 03/19/2022. Repeat every year  Bone Density status: Completed 10/31/2022. Results reflect: Bone density results: OSTEOPOROSIS. Repeat every 2 years.  Lung Cancer Screening: (Low Dose CT Chest recommended if Age 17-80 years, 30 pack-year currently smoking OR have quit w/in 15years.) does not qualify.   Lung Cancer Screening Referral: n/a  Additional Screening:  Hepatitis C Screening: does not qualify; Completed 10/11/2015  Vision Screening: Recommended annual ophthalmology exams for early detection of glaucoma and other disorders of the eye. Is the patient up to date with their annual eye exam?  Yes  Who is the provider or what is the name of the office in which the patient attends annual eye exams? Dr.Lee  If pt is not established with a provider, would they like to be referred to a provider to establish care? No .   Dental Screening: Recommended annual dental exams for proper oral hygiene  Community Resource Referral / Chronic Care Management: CRR required this visit?  No   CCM required this visit?  No      Plan:     I have personally reviewed and noted the following in the patient's chart:   Medical and social history Use of alcohol, tobacco or illicit drugs  Current medications and supplements including opioid prescriptions. Patient is not currently taking opioid prescriptions. Functional ability and status Nutritional status Physical activity Advanced directives List of other physicians Hospitalizations, surgeries, and ER visits in previous 12 months Vitals Screenings to include cognitive, depression, and falls Referrals and appointments  In addition, I have reviewed and discussed with patient certain preventive protocols, quality metrics, and best practice recommendations. A written personalized care plan for preventive services as well as general preventive health recommendations were provided to  patient.     Lorrene Reid, LPN   07/05/1094   Nurse Notes: none

## 2022-11-04 NOTE — Patient Instructions (Signed)
Gabriela Gardner , Thank you for taking time to come for your Medicare Wellness Visit. I appreciate your ongoing commitment to your health goals. Please review the following plan we discussed and let me know if I can assist you in the future.   These are the goals we discussed:  Goals      DIET - INCREASE WATER INTAKE     Try to drink 6-8 glasses of water daily     Exercise 150 min/wk Moderate Activity        This is a list of the screening recommended for you and due dates:  Health Maintenance  Topic Date Due   COVID-19 Vaccine (7 - 2023-24 season) 11/16/2022*   Colon Cancer Screening  01/19/2023   Flu Shot  01/29/2023   Mammogram  03/20/2023   Medicare Annual Wellness Visit  11/04/2023   DEXA scan (bone density measurement)  10/30/2024   DTaP/Tdap/Td vaccine (2 - Td or Tdap) 10/30/2031   Pneumonia Vaccine  Completed   Hepatitis C Screening: USPSTF Recommendation to screen - Ages 67-79 yo.  Completed   Zoster (Shingles) Vaccine  Completed   HPV Vaccine  Aged Out  *Topic was postponed. The date shown is not the original due date.    Advanced directives: Advance directive discussed with you today. I have provided a copy for you to complete at home and have notarized. Once this is complete please bring a copy in to our office so we can scan it into your chart.   Conditions/risks identified: Aim for 30 minutes of exercise or brisk walking, 6-8 glasses of water, and 5 servings of fruits and vegetables each day.   Next appointment: Follow up in one year for your annual wellness visit    Preventive Care 65 Years and Older, Female Preventive care refers to lifestyle choices and visits with your health care provider that can promote health and wellness. What does preventive care include? A yearly physical exam. This is also called an annual well check. Dental exams once or twice a year. Routine eye exams. Ask your health care provider how often you should have your eyes checked. Personal  lifestyle choices, including: Daily care of your teeth and gums. Regular physical activity. Eating a healthy diet. Avoiding tobacco and drug use. Limiting alcohol use. Practicing safe sex. Taking low-dose aspirin every day. Taking vitamin and mineral supplements as recommended by your health care provider. What happens during an annual well check? The services and screenings done by your health care provider during your annual well check will depend on your age, overall health, lifestyle risk factors, and family history of disease. Counseling  Your health care provider may ask you questions about your: Alcohol use. Tobacco use. Drug use. Emotional well-being. Home and relationship well-being. Sexual activity. Eating habits. History of falls. Memory and ability to understand (cognition). Work and work Astronomer. Reproductive health. Screening  You may have the following tests or measurements: Height, weight, and BMI. Blood pressure. Lipid and cholesterol levels. These may be checked every 5 years, or more frequently if you are over 61 years old. Skin check. Lung cancer screening. You may have this screening every year starting at age 87 if you have a 30-pack-year history of smoking and currently smoke or have quit within the past 15 years. Fecal occult blood test (FOBT) of the stool. You may have this test every year starting at age 2. Flexible sigmoidoscopy or colonoscopy. You may have a sigmoidoscopy every 5 years or a colonoscopy  every 10 years starting at age 13. Hepatitis C blood test. Hepatitis B blood test. Sexually transmitted disease (STD) testing. Diabetes screening. This is done by checking your blood sugar (glucose) after you have not eaten for a while (fasting). You may have this done every 1-3 years. Bone density scan. This is done to screen for osteoporosis. You may have this done starting at age 51. Mammogram. This may be done every 1-2 years. Talk to your  health care provider about how often you should have regular mammograms. Talk with your health care provider about your test results, treatment options, and if necessary, the need for more tests. Vaccines  Your health care provider may recommend certain vaccines, such as: Influenza vaccine. This is recommended every year. Tetanus, diphtheria, and acellular pertussis (Tdap, Td) vaccine. You may need a Td booster every 10 years. Zoster vaccine. You may need this after age 33. Pneumococcal 13-valent conjugate (PCV13) vaccine. One dose is recommended after age 21. Pneumococcal polysaccharide (PPSV23) vaccine. One dose is recommended after age 39. Talk to your health care provider about which screenings and vaccines you need and how often you need them. This information is not intended to replace advice given to you by your health care provider. Make sure you discuss any questions you have with your health care provider. Document Released: 07/13/2015 Document Revised: 03/05/2016 Document Reviewed: 04/17/2015 Elsevier Interactive Patient Education  2017 [place] Prevention in the Home Falls can cause injuries. They can happen to people of all ages. There are many things you can do to make your home safe and to help prevent falls. What can I do on the outside of my home? Regularly fix the edges of walkways and driveways and fix any cracks. Remove anything that might make you trip as you walk through a door, such as a raised step or threshold. Trim any bushes or trees on the path to your home. Use bright outdoor lighting. Clear any walking paths of anything that might make someone trip, such as rocks or tools. Regularly check to see if handrails are loose or broken. Make sure that both sides of any steps have handrails. Any raised decks and porches should have guardrails on the edges. Have any leaves, snow, or ice cleared regularly. Use sand or salt on walking paths during winter. Clean  up any spills in your garage right away. This includes oil or grease spills. What can I do in the bathroom? Use night lights. Install grab bars by the toilet and in the tub and shower. Do not use towel bars as grab bars. Use non-skid mats or decals in the tub or shower. If you need to sit down in the shower, use a plastic, non-slip stool. Keep the floor dry. Clean up any water that spills on the floor as soon as it happens. Remove soap buildup in the tub or shower regularly. Attach bath mats securely with double-sided non-slip rug tape. Do not have throw rugs and other things on the floor that can make you trip. What can I do in the bedroom? Use night lights. Make sure that you have a light by your bed that is easy to reach. Do not use any sheets or blankets that are too big for your bed. They should not hang down onto the floor. Have a firm chair that has side arms. You can use this for support while you get dressed. Do not have throw rugs and other things on the floor that can make  you trip. What can I do in the kitchen? Clean up any spills right away. Avoid walking on wet floors. Keep items that you use a lot in easy-to-reach places. If you need to reach something above you, use a strong step stool that has a grab bar. Keep electrical cords out of the way. Do not use floor polish or wax that makes floors slippery. If you must use wax, use non-skid floor wax. Do not have throw rugs and other things on the floor that can make you trip. What can I do with my stairs? Do not leave any items on the stairs. Make sure that there are handrails on both sides of the stairs and use them. Fix handrails that are broken or loose. Make sure that handrails are as long as the stairways. Check any carpeting to make sure that it is firmly attached to the stairs. Fix any carpet that is loose or worn. Avoid having throw rugs at the top or bottom of the stairs. If you do have throw rugs, attach them to the  floor with carpet tape. Make sure that you have a light switch at the top of the stairs and the bottom of the stairs. If you do not have them, ask someone to add them for you. What else can I do to help prevent falls? Wear shoes that: Do not have high heels. Have rubber bottoms. Are comfortable and fit you well. Are closed at the toe. Do not wear sandals. If you use a stepladder: Make sure that it is fully opened. Do not climb a closed stepladder. Make sure that both sides of the stepladder are locked into place. Ask someone to hold it for you, if possible. Clearly mark and make sure that you can see: Any grab bars or handrails. First and last steps. Where the edge of each step is. Use tools that help you move around (mobility aids) if they are needed. These include: Canes. Walkers. Scooters. Crutches. Turn on the lights when you go into a dark area. Replace any light bulbs as soon as they burn out. Set up your furniture so you have a clear path. Avoid moving your furniture around. If any of your floors are uneven, fix them. If there are any pets around you, be aware of where they are. Review your medicines with your doctor. Some medicines can make you feel dizzy. This can increase your chance of falling. Ask your doctor what other things that you can do to help prevent falls. This information is not intended to replace advice given to you by your health care provider. Make sure you discuss any questions you have with your health care provider. Document Released: 04/12/2009 Document Revised: 11/22/2015 Document Reviewed: 07/21/2014 Elsevier Interactive Patient Education  2017 [employer].

## 2022-12-11 IMAGING — MG MM DIGITAL DIAGNOSTIC UNILAT*L* W/ TOMO W/ CAD
4 series · 4 of 12 positions shown · non-contrast
Comparison: Previous exam(s).

CLINICAL DATA: Patient recalled from screening for left breast
asymmetry.

EXAM:
DIGITAL DIAGNOSTIC UNILATERAL LEFT MAMMOGRAM WITH TOMOSYNTHESIS AND
CAD
TECHNIQUE: Left digital diagnostic mammography and breast tomosynthesis was
performed. The images were evaluated with computer-aided detection.

[L ML synth-2D]
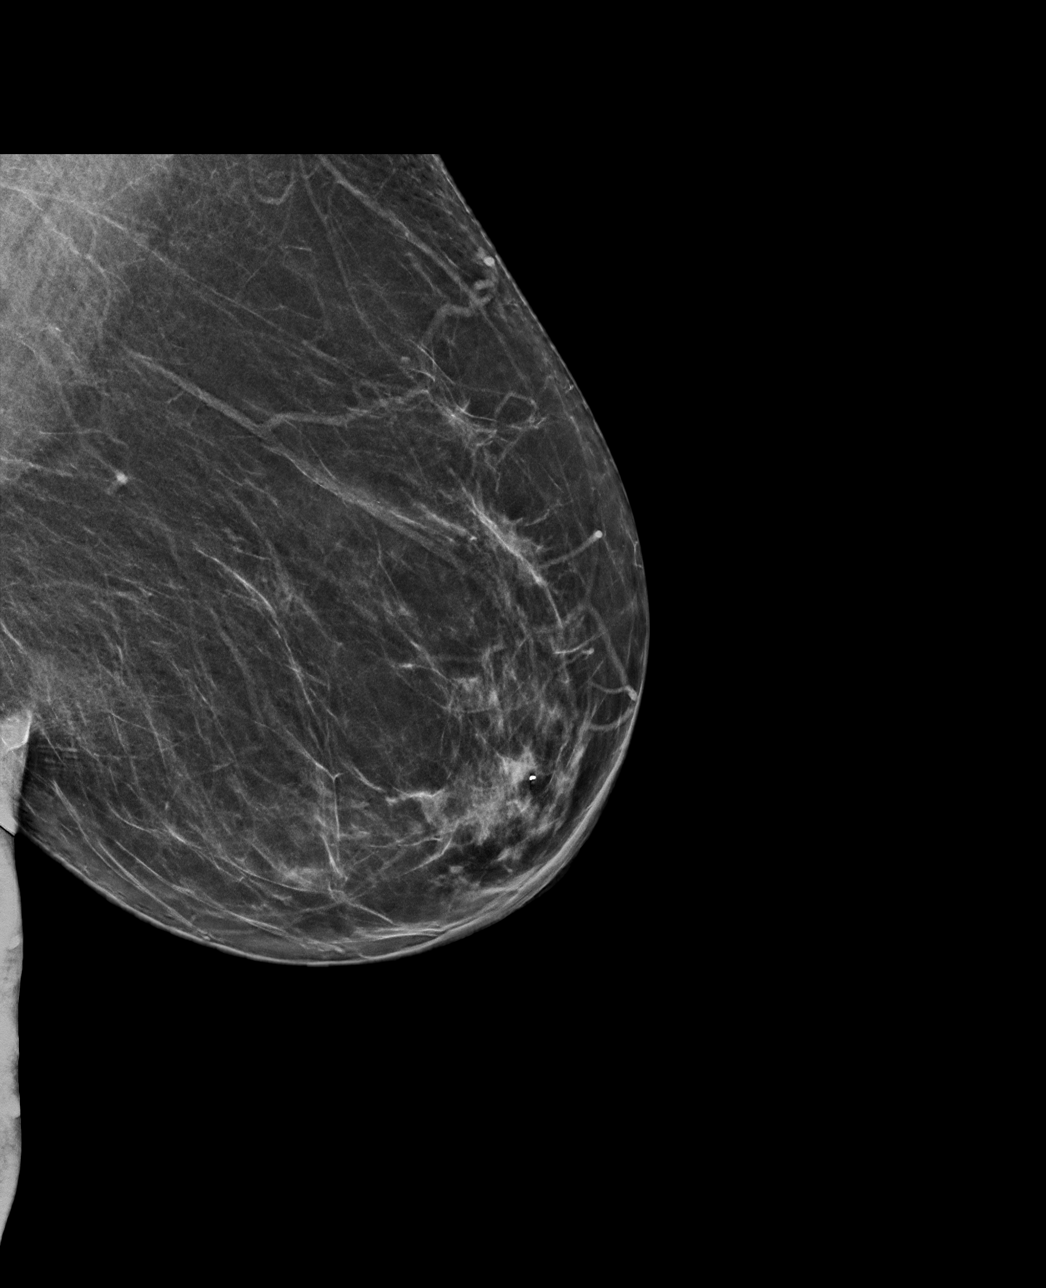

[L CC synth-2D]
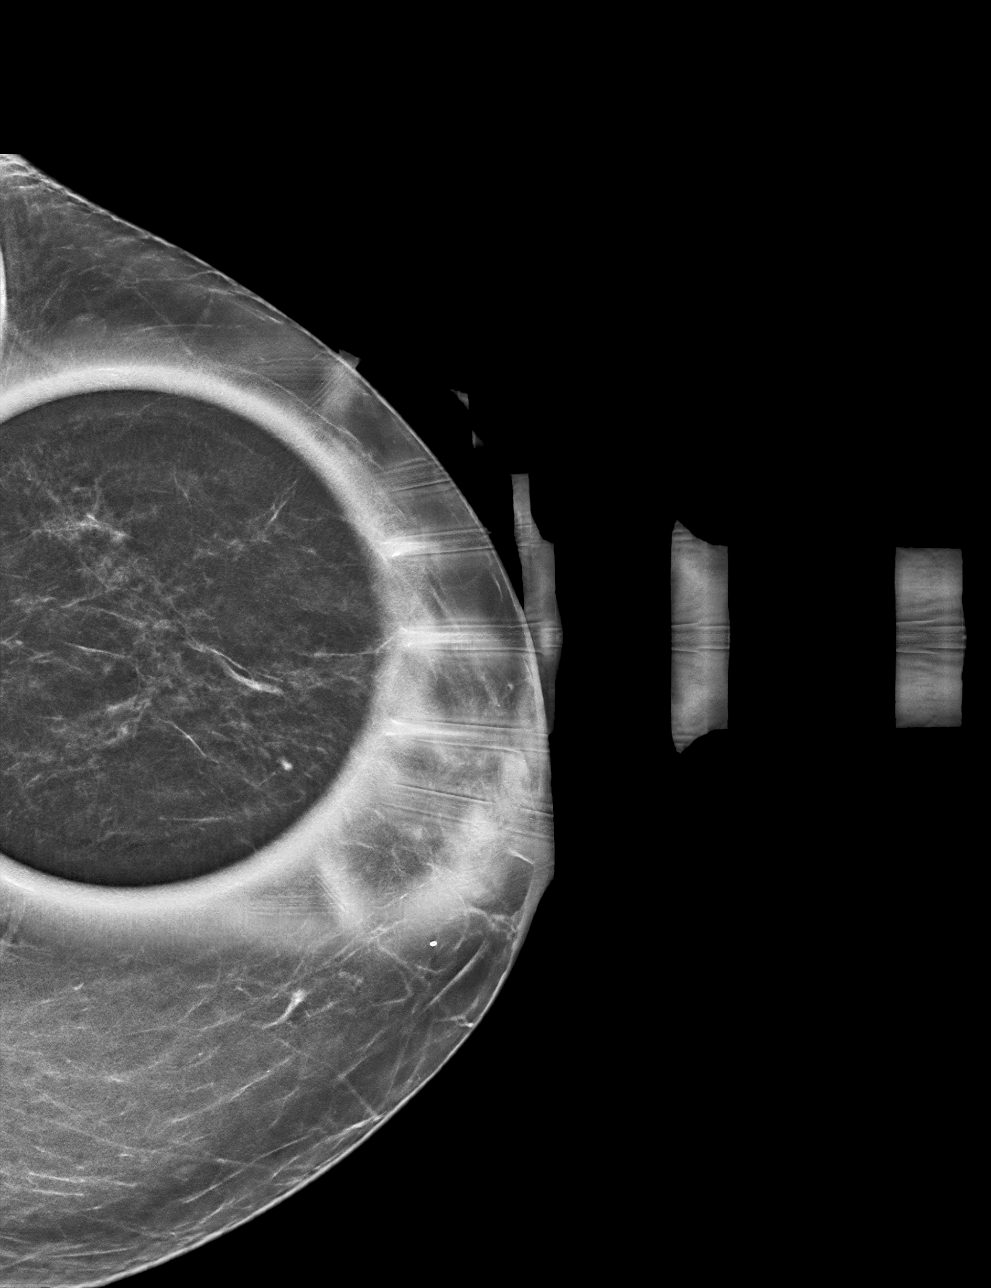

[L ML tomo · tomo slice 34/67.0]
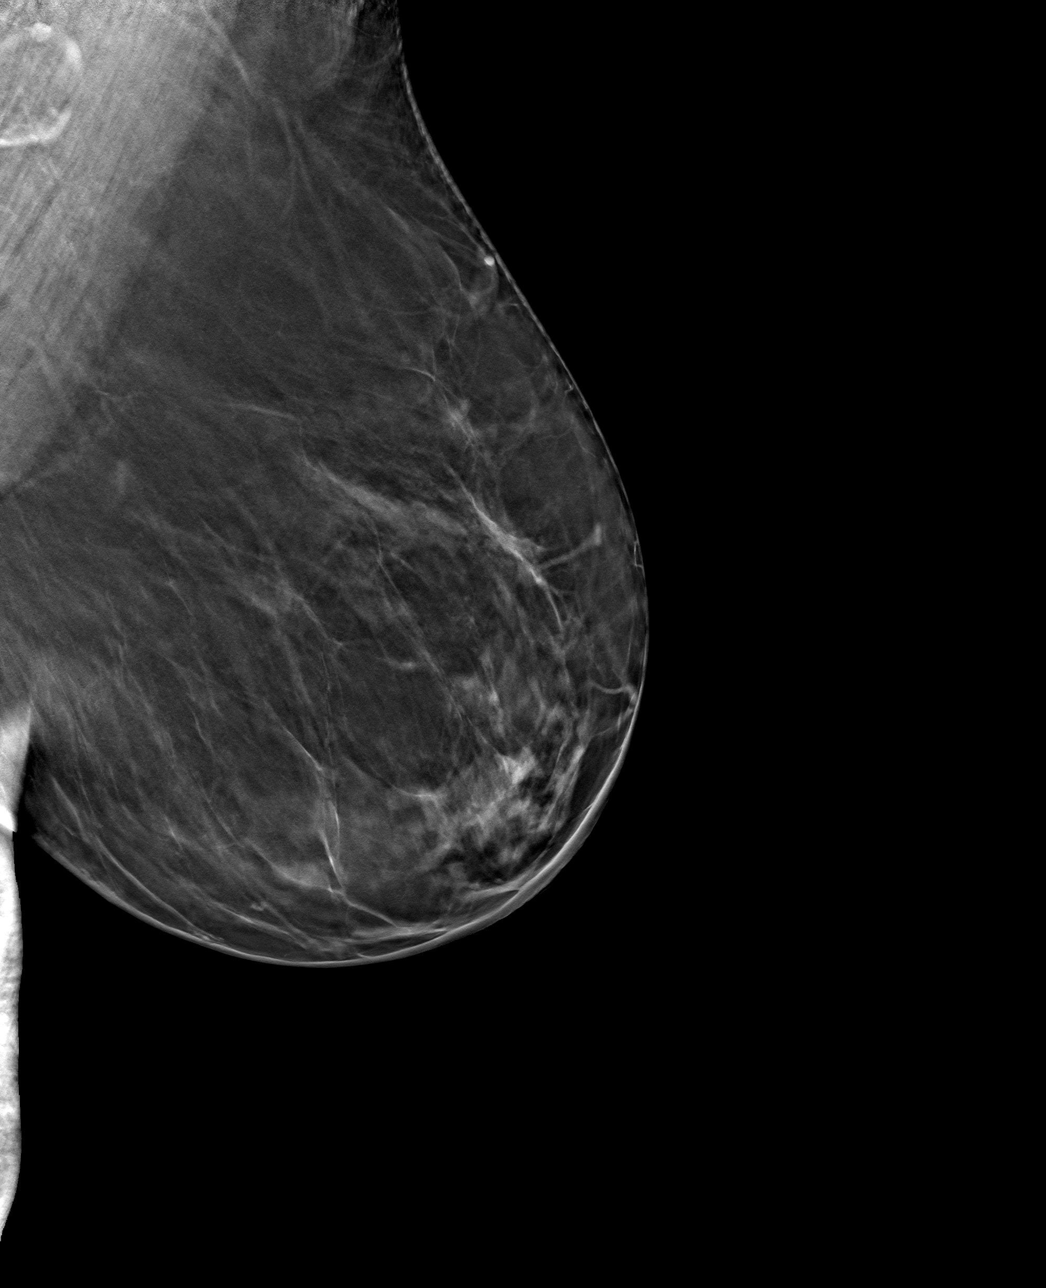

[L CC tomo · tomo slice 25/50.0]
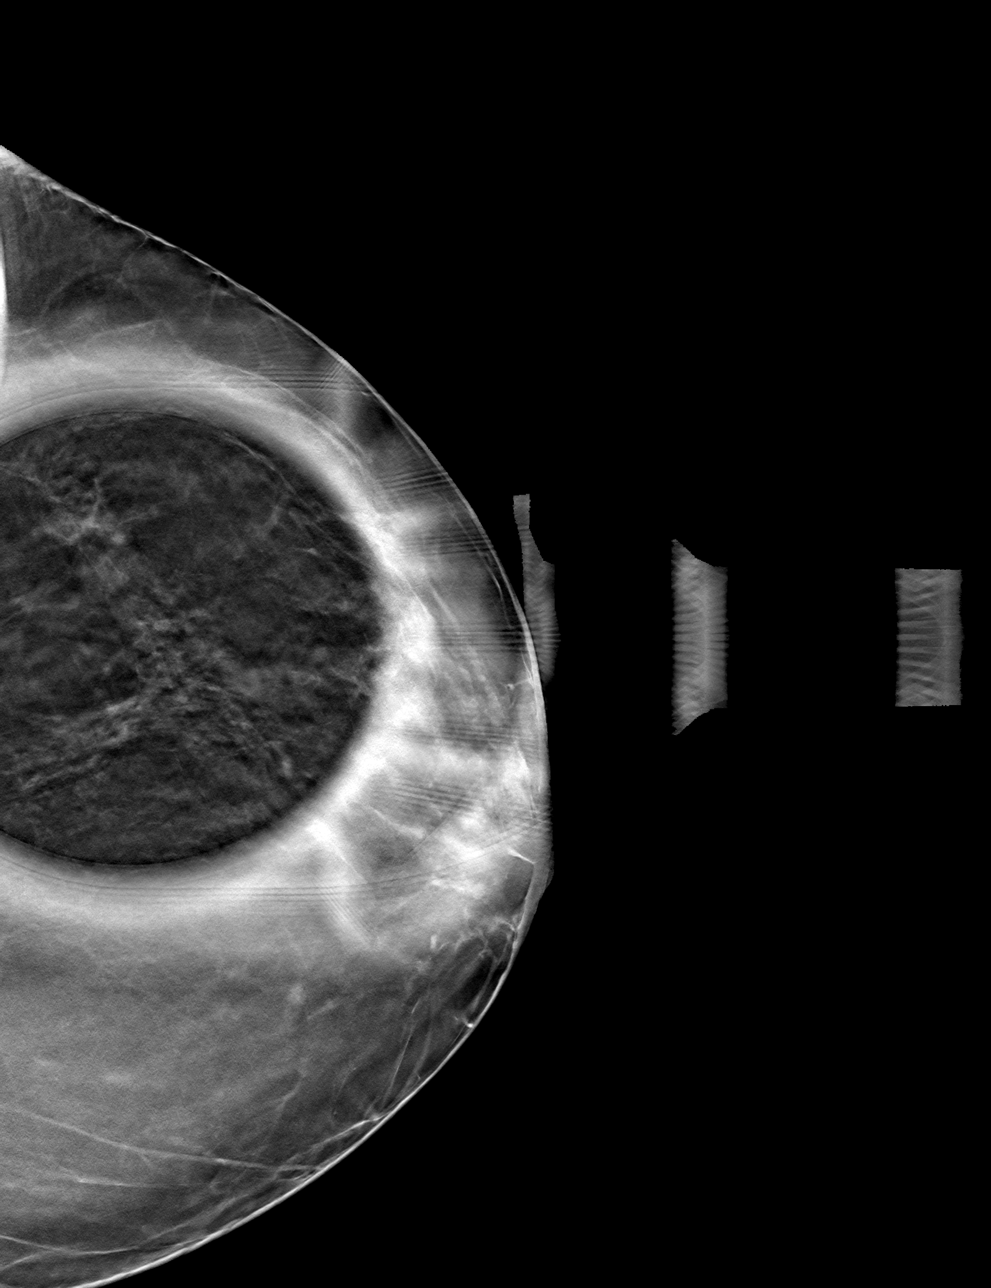

[4 of 12 positions shown; findings below may reference images not displayed]

ACR Breast Density Category b: There are scattered areas of
fibroglandular density.
FINDINGS: Questioned asymmetry within the outer left breast resolved with
additional imaging compatible with dense overlapping fibroglandular
tissue. No suspicious findings.
IMPRESSION: No mammographic evidence for malignancy.

RECOMMENDATION:
Screening mammogram in one year.(Code:BG-8-QEL)

I have discussed the findings and recommendations with the patient.
If applicable, a reminder letter will be sent to the patient
regarding the next appointment.

BI-RADS CATEGORY  1: Negative.

## 2023-01-20 DIAGNOSIS — H269 Unspecified cataract: Secondary | ICD-10-CM | POA: Diagnosis not present

## 2023-01-20 DIAGNOSIS — H5213 Myopia, bilateral: Secondary | ICD-10-CM | POA: Diagnosis not present

## 2023-01-20 DIAGNOSIS — H52202 Unspecified astigmatism, left eye: Secondary | ICD-10-CM | POA: Diagnosis not present

## 2023-01-20 DIAGNOSIS — H5211 Myopia, right eye: Secondary | ICD-10-CM | POA: Diagnosis not present

## 2023-03-16 DIAGNOSIS — D2261 Melanocytic nevi of right upper limb, including shoulder: Secondary | ICD-10-CM | POA: Diagnosis not present

## 2023-03-16 DIAGNOSIS — D2262 Melanocytic nevi of left upper limb, including shoulder: Secondary | ICD-10-CM | POA: Diagnosis not present

## 2023-03-16 DIAGNOSIS — D2239 Melanocytic nevi of other parts of face: Secondary | ICD-10-CM | POA: Diagnosis not present

## 2023-03-16 DIAGNOSIS — D224 Melanocytic nevi of scalp and neck: Secondary | ICD-10-CM | POA: Diagnosis not present

## 2023-03-16 DIAGNOSIS — D225 Melanocytic nevi of trunk: Secondary | ICD-10-CM | POA: Diagnosis not present

## 2023-03-16 DIAGNOSIS — L905 Scar conditions and fibrosis of skin: Secondary | ICD-10-CM | POA: Diagnosis not present

## 2023-03-16 DIAGNOSIS — Z85820 Personal history of malignant melanoma of skin: Secondary | ICD-10-CM | POA: Diagnosis not present

## 2023-03-16 DIAGNOSIS — L821 Other seborrheic keratosis: Secondary | ICD-10-CM | POA: Diagnosis not present

## 2023-03-20 ENCOUNTER — Other Ambulatory Visit: Payer: Self-pay | Admitting: Nurse Practitioner

## 2023-03-20 DIAGNOSIS — Z1231 Encounter for screening mammogram for malignant neoplasm of breast: Secondary | ICD-10-CM

## 2023-03-25 ENCOUNTER — Ambulatory Visit
Admission: RE | Admit: 2023-03-25 | Discharge: 2023-03-25 | Disposition: A | Discharge status: Home / Self Care | Payer: No Typology Code available for payment source | Source: Ambulatory Visit | Attending: Nurse Practitioner | Admitting: Nurse Practitioner

## 2023-03-25 DIAGNOSIS — Z1231 Encounter for screening mammogram for malignant neoplasm of breast: Secondary | ICD-10-CM

## 2023-05-04 ENCOUNTER — Ambulatory Visit (INDEPENDENT_AMBULATORY_CARE_PROVIDER_SITE_OTHER): Payer: No Typology Code available for payment source | Admitting: Nurse Practitioner

## 2023-05-04 ENCOUNTER — Ambulatory Visit: Payer: No Typology Code available for payment source

## 2023-05-04 ENCOUNTER — Encounter: Payer: Self-pay | Admitting: Nurse Practitioner

## 2023-05-04 VITALS — BP 147/82 | HR 57 | Temp 97.1°F | Resp 20 | Ht 64.0 in | Wt 159.0 lb

## 2023-05-04 DIAGNOSIS — Z6828 Body mass index (BMI) 28.0-28.9, adult: Secondary | ICD-10-CM

## 2023-05-04 DIAGNOSIS — E7800 Pure hypercholesterolemia, unspecified: Secondary | ICD-10-CM | POA: Diagnosis not present

## 2023-05-04 DIAGNOSIS — F5101 Primary insomnia: Secondary | ICD-10-CM

## 2023-05-04 DIAGNOSIS — D75839 Thrombocytosis, unspecified: Secondary | ICD-10-CM

## 2023-05-04 DIAGNOSIS — K219 Gastro-esophageal reflux disease without esophagitis: Secondary | ICD-10-CM | POA: Diagnosis not present

## 2023-05-04 DIAGNOSIS — I10 Essential (primary) hypertension: Secondary | ICD-10-CM

## 2023-05-04 DIAGNOSIS — E039 Hypothyroidism, unspecified: Secondary | ICD-10-CM

## 2023-05-04 MED ORDER — FERROUS SULFATE 325 (65 FE) MG PO TBEC
325.0000 mg | DELAYED_RELEASE_TABLET | Freq: Two times a day (BID) | ORAL | 1 refills | Status: DC
Start: 2023-05-04 — End: 2023-11-02

## 2023-05-04 MED ORDER — ATORVASTATIN CALCIUM 40 MG PO TABS
40.0000 mg | ORAL_TABLET | Freq: Every day | ORAL | 1 refills | Status: DC
Start: 2023-05-04 — End: 2023-09-02

## 2023-05-04 MED ORDER — AMLODIPINE BESYLATE 5 MG PO TABS
5.0000 mg | ORAL_TABLET | Freq: Every day | ORAL | 1 refills | Status: DC
Start: 2023-05-04 — End: 2023-09-02

## 2023-05-04 MED ORDER — OMEPRAZOLE 40 MG PO CPDR
40.0000 mg | DELAYED_RELEASE_CAPSULE | Freq: Every day | ORAL | 1 refills | Status: DC
Start: 2023-05-04 — End: 2023-09-02

## 2023-05-04 MED ORDER — LISINOPRIL-HYDROCHLOROTHIAZIDE 20-12.5 MG PO TABS
2.0000 | ORAL_TABLET | Freq: Every day | ORAL | 1 refills | Status: DC
Start: 2023-05-04 — End: 2023-09-02

## 2023-05-04 MED ORDER — LEVOTHYROXINE SODIUM 100 MCG PO TABS
100.0000 ug | ORAL_TABLET | Freq: Every day | ORAL | 1 refills | Status: DC
Start: 2023-05-04 — End: 2023-09-02

## 2023-05-04 NOTE — Patient Instructions (Signed)
Exercising to Stay Healthy To become healthy and stay healthy, it is recommended that you do moderate-intensity and vigorous-intensity exercise. You can tell that you are exercising at a moderate intensity if your heart starts beating faster and you start breathing faster but can still hold a conversation. You can tell that you are exercising at a vigorous intensity if you are breathing much harder and faster and cannot hold a conversation while exercising. How can exercise benefit me? Exercising regularly is important. It has many health benefits, such as: Improving overall fitness, flexibility, and endurance. Increasing bone density. Helping with weight control. Decreasing body fat. Increasing muscle strength and endurance. Reducing stress and tension, anxiety, depression, or anger. Improving overall health. What guidelines should I follow while exercising? Before you start a new exercise program, talk with your health care provider. Do not exercise so much that you hurt yourself, feel dizzy, or get very short of breath. Wear comfortable clothes and wear shoes with good support. Drink plenty of water while you exercise to prevent dehydration or heat stroke. Work out until your breathing and your heartbeat get faster (moderate intensity). How often should I exercise? Choose an activity that you enjoy, and set realistic goals. Your health care provider can help you make an activity plan that is individually designed and works best for you. Exercise regularly as told by your health care provider. This may include: Doing strength training two times a week, such as: Lifting weights. Using resistance bands. Push-ups. Sit-ups. Yoga. Doing a certain intensity of exercise for a given amount of time. Choose from these options: A total of 150 minutes of moderate-intensity exercise every week. A total of 75 minutes of vigorous-intensity exercise every week. A mix of moderate-intensity and  vigorous-intensity exercise every week. Children, pregnant women, people who have not exercised regularly, people who are overweight, and older adults may need to talk with a health care provider about what activities are safe to perform. If you have a medical condition, be sure to talk with your health care provider before you start a new exercise program. What are some exercise ideas? Moderate-intensity exercise ideas include: Walking 1 mile (1.6 km) in about 15 minutes. Biking. Hiking. Golfing. Dancing. Water aerobics. Vigorous-intensity exercise ideas include: Walking 4.5 miles (7.2 km) or more in about 1 hour. Jogging or running 5 miles (8 km) in about 1 hour. Biking 10 miles (16.1 km) or more in about 1 hour. Lap swimming. Roller-skating or in-line skating. Cross-country skiing. Vigorous competitive sports, such as football, basketball, and soccer. Jumping rope. Aerobic dancing. What are some everyday activities that can help me get exercise? Yard work, such as: Pushing a lawn mower. Raking and bagging leaves. Washing your car. Pushing a stroller. Shoveling snow. Gardening. Washing windows or floors. How can I be more active in my day-to-day activities? Use stairs instead of an elevator. Take a walk during your lunch break. If you drive, park your car farther away from your work or school. If you take public transportation, get off one stop early and walk the rest of the way. Stand up or walk around during all of your indoor phone calls. Get up, stretch, and walk around every 30 minutes throughout the day. Enjoy exercise with a friend. Support to continue exercising will help you keep a regular routine of activity. Where to find more information You can find more information about exercising to stay healthy from: [place] Department of Health and Human Services: www.hhs.gov Centers for Disease Control and Prevention (  CDC): www.cdc.gov Summary Exercising regularly is  important. It will improve your overall fitness, flexibility, and endurance. Regular exercise will also improve your overall health. It can help you control your weight, reduce stress, and improve your bone density. Do not exercise so much that you hurt yourself, feel dizzy, or get very short of breath. Before you start a new exercise program, talk with your health care provider. This information is not intended to replace advice given to you by your health care provider. Make sure you discuss any questions you have with your health care provider. Document Revised: 10/12/2020 Document Reviewed: 10/12/2020 Elsevier Patient Education  2024 [employer] Inc[employer]  

## 2023-05-04 NOTE — Progress Notes (Signed)
Subjective:    Patient ID: Gabriela Gardner, female    DOB: 10-25-1950, 72 y.o.   MRN: [id number]   Chief Complaint: medical management of chronic issues     HPI:  Gabriela Gardner is a 72 y.o. who identifies as a female who was assigned female at birth.   Social history: Lives with: husband Work history: retired   Water engineer in today for follow up of the following chronic medical issues:  1. Primary hypertension No c/o chest pain, sob or headache. Does not check blood pressure at home. BP Readings from Last 3 Encounters:  10/31/22 134/79  05/02/22 126/79  01/08/22 132/74     2. Pure hypercholesterolemia Does try to watch diet and stays very active Lab Results  Component Value Date   CHOL 210 (H) 10/31/2022   HDL 53 10/31/2022   LDLCALC 112 (H) 10/31/2022   TRIG 264 (H) 10/31/2022   CHOLHDL 4.0 10/31/2022    The 36-UYQI ASCVD risk score (Arnett DK, et al., 2019) is: 22.3%  3. Acquired hypothyroidism No issues  that she is aware of. Lab Results  Component Value Date   TSH 1.930 10/31/2022     4. Gastroesophageal reflux disease without esophagitis Omperazole works well for her on a daily basis.   5. Primary insomnia Better, she takes 2 extra strength tylenol  6. BMI 28.0-28.9,adult No recent weight changes Wt Readings from Last 3 Encounters:  05/04/23 159 lb (72.1 kg)  11/04/22 160 lb (72.6 kg)  10/31/22 161 lb (73 kg)   BMI Readings from Last 3 Encounters:  05/04/23 27.29 kg/m  11/04/22 27.46 kg/m  10/31/22 27.64 kg/m      New complaints: None today  Allergies  Allergen Reactions   Codeine Rash   Outpatient Encounter Medications as of 05/04/2023  Medication Sig   amLODipine (NORVASC) 5 MG tablet Take 1 tablet (5 mg total) by mouth daily.   Ascorbic Acid (VITAMIN C PO) Take by mouth.   atorvastatin (LIPITOR) 40 MG tablet Take 1 tablet (40 mg total) by mouth daily.   calcium carbonate (OS-CAL) 600 MG TABS Take 600 mg by mouth daily.    Cyanocobalamin (B-12 PO) Take by mouth.   ferrous sulfate 325 (65 FE) MG EC tablet Take 1 tablet (325 mg total) by mouth 2 (two) times daily before a meal.   levothyroxine (SYNTHROID) 100 MCG tablet Take 1 tablet (100 mcg total) by mouth daily.   lisinopril-hydrochlorothiazide (ZESTORETIC) 20-12.5 MG tablet Take 2 tablets by mouth daily.   meloxicam (MOBIC) 15 MG tablet Take 1 tablet (15 mg total) by mouth daily.   Multiple Vitamin (MULTIVITAMIN) tablet Take 1 tablet by mouth daily.   omeprazole (PRILOSEC) 40 MG capsule Take 1 capsule (40 mg total) by mouth daily.   No facility-administered encounter medications on file as of 05/04/2023.    Past Surgical History:  Procedure Laterality Date   ABDOMINAL HYSTERECTOMY     SKIN LESION EXCISION      Family History  Adopted: Yes  Problem Relation Age of Onset   Turner syndrome Daughter    Asthma Son    Breast cancer Neg Hx       Controlled substance contract: n/a     Review of Systems  Constitutional:  Negative for diaphoresis.  Eyes:  Negative for pain.  Respiratory:  Negative for shortness of breath.   Cardiovascular:  Negative for chest pain, palpitations and leg swelling.  Gastrointestinal:  Negative for abdominal pain.  Endocrine: Negative for polydipsia.  Skin:  Negative for rash.  Neurological:  Negative for dizziness, weakness and headaches.  Hematological:  Does not bruise/bleed easily.  All other systems reviewed and are negative.      Objective:   Physical Exam Vitals and nursing note reviewed.  Constitutional:      General: She is not in acute distress.    Appearance: Normal appearance. She is well-developed.  HENT:     Head: Normocephalic.     Right Ear: Tympanic membrane normal.     Left Ear: Tympanic membrane normal.     Nose: Nose normal.     Mouth/Throat:     Mouth: Mucous membranes are moist.  Eyes:     Pupils: Pupils are equal, round, and reactive to light.  Neck:     Vascular: No carotid bruit  or JVD.  Cardiovascular:     Rate and Rhythm: Normal rate and regular rhythm.     Heart sounds: Normal heart sounds.  Pulmonary:     Effort: Pulmonary effort is normal. No respiratory distress.     Breath sounds: Normal breath sounds. No wheezing or rales.  Chest:     Chest wall: No tenderness.  Abdominal:     General: Bowel sounds are normal. There is no distension or abdominal bruit.     Palpations: Abdomen is soft. There is no hepatomegaly, splenomegaly, mass or pulsatile mass.     Tenderness: There is no abdominal tenderness.  Musculoskeletal:        General: Normal range of motion.     Cervical back: Normal range of motion and neck supple.  Lymphadenopathy:     Cervical: No cervical adenopathy.  Skin:    General: Skin is warm and dry.  Neurological:     Mental Status: She is alert and oriented to person, place, and time.     Deep Tendon Reflexes: Reflexes are normal and symmetric.  Psychiatric:        Behavior: Behavior normal.        Thought Content: Thought content normal.        Judgment: Judgment normal.     BP (!) 147/82   Pulse (!) 57   Temp (!) 97.1 F (36.2 C) (Temporal)   Resp 20   Ht 5\' 4"  (1.626 m)   Wt 159 lb (72.1 kg)   SpO2 99%   BMI 27.29 kg/m        Assessment & Plan:  Gabriela Gardner comes in today with chief complaint of Medical Management of Chronic Issues   Diagnosis and orders addressed:  1. Primary hypertension Low sodium diet - amLODipine (NORVASC) 5 MG tablet; Take 1 tablet (5 mg total) by mouth daily.  Dispense: 90 tablet; Refill: 1 - lisinopril-hydrochlorothiazide (ZESTORETIC) 20-12.5 MG tablet; Take 2 tablets by mouth daily.  Dispense: 180 tablet; Refill: 1 - CBC with Differential/Platelet - CMP14+EGFR  2. Pure hypercholesterolemia Low fat diet - atorvastatin (LIPITOR) 40 MG tablet; Take 1 tablet (40 mg total) by mouth daily.  Dispense: 90 tablet; Refill: 1 - Lipid panel  3. Acquired hypothyroidism - lab pending -  levothyroxine (SYNTHROID) 100 MCG tablet; Take 1 tablet (100 mcg total) by mouth daily.  Dispense: 90 tablet; Refill: 1 - Thyroid Panel With TSH  4. Gastroesophageal reflux disease without esophagitis Avoid spicy foods Do not eat 2 hours prior to bedtime - omeprazole (PRILOSEC) 40 MG capsule; Take 1 capsule (40 mg total) by mouth daily.  Dispense: 90 capsule; Refill: 1  5. Primary insomnia Bedtime routine  6. BMI 28.0-28.9,adult Discussed diet and exercise for person with BMI >25 Will recheck weight in 3-6 months   7. Thrombocytosis Labs pending - ferrous sulfate 325 (65 FE) MG EC tablet; Take 1 tablet (325 mg total) by mouth 2 (two) times daily before a meal.  Dispense: 90 tablet; Refill: 1   Labs pending Health Maintenance reviewed Diet and exercise encouraged  Follow up plan: 6 months   Gabriela Daphine Deutscher, FNP

## 2023-05-05 LAB — CMP14+EGFR
ALT: 35 IU/L — ABNORMAL HIGH (ref 0–32)
AST: 34 [IU]/L (ref 0–40)
Albumin: 4.9 g/dL — ABNORMAL HIGH (ref 3.8–4.8)
Alkaline Phosphatase: 71 [IU]/L (ref 44–121)
BUN/Creatinine Ratio: 12 (ref 12–28)
BUN: 12 mg/dL (ref 8–27)
Bilirubin Total: 0.7 mg/dL (ref 0.0–1.2)
CO2: 25 mmol/L (ref 20–29)
Calcium: 10.1 mg/dL (ref 8.7–10.3)
Chloride: 100 mmol/L (ref 96–106)
Creatinine, Ser: 0.98 mg/dL (ref 0.57–1.00)
Globulin, Total: 2.3 g/dL (ref 1.5–4.5)
Glucose: 113 mg/dL — ABNORMAL HIGH (ref 70–99)
Potassium: 4.6 mmol/L (ref 3.5–5.2)
Sodium: 141 mmol/L (ref 134–144)
Total Protein: 7.2 g/dL (ref 6.0–8.5)
eGFR: 61 mL/min/{1.73_m2} (ref >59)

## 2023-05-05 LAB — LIPID PANEL
Chol/HDL Ratio: 3.6 ratio (ref 0.0–4.4)
Cholesterol, Total: 182 mg/dL (ref 100–199)
HDL: 51 mg/dL (ref >39)
LDL Chol Calc (NIH): 82 mg/dL (ref 0–99)
Triglycerides: 304 mg/dL — ABNORMAL HIGH (ref 0–149)
VLDL Cholesterol Cal: 49 mg/dL — ABNORMAL HIGH (ref 5–40)

## 2023-05-05 LAB — CBC WITH DIFFERENTIAL/PLATELET
Basophils Absolute: 0.1 10*3/uL (ref 0.0–0.2)
Basos: 1 %
EOS (ABSOLUTE): 0.3 10*3/uL (ref 0.0–0.4)
Eos: 5 %
Hematocrit: 43.2 % (ref 34.0–46.6)
Hemoglobin: 13.9 g/dL (ref 11.1–15.9)
Immature Grans (Abs): 0 10*3/uL (ref 0.0–0.1)
Immature Granulocytes: 0 %
Lymphocytes Absolute: 1.1 10*3/uL (ref 0.7–3.1)
Lymphs: 19 %
MCH: 31.1 pg (ref 26.6–33.0)
MCHC: 32.2 g/dL (ref 31.5–35.7)
MCV: 97 fL (ref 79–97)
Monocytes Absolute: 0.5 10*3/uL (ref 0.1–0.9)
Monocytes: 8 %
Neutrophils Absolute: 4 10*3/uL (ref 1.4–7.0)
Neutrophils: 67 %
Platelets: 253 10*3/uL (ref 150–450)
RBC: 4.47 x10E6/uL (ref 3.77–5.28)
RDW: 11.7 % (ref 11.7–15.4)
WBC: 5.9 10*3/uL (ref 3.4–10.8)

## 2023-05-05 LAB — THYROID PANEL WITH TSH
Free Thyroxine Index: 2.5 (ref 1.2–4.9)
T3 Uptake Ratio: 26 % (ref 24–39)
T4, Total: 9.6 ug/dL (ref 4.5–12.0)
TSH: 1.58 u[IU]/mL (ref 0.450–4.500)

## 2023-05-08 ENCOUNTER — Encounter: Payer: Self-pay | Admitting: Nurse Practitioner

## 2023-05-08 ENCOUNTER — Ambulatory Visit (INDEPENDENT_AMBULATORY_CARE_PROVIDER_SITE_OTHER): Payer: No Typology Code available for payment source | Admitting: Nurse Practitioner

## 2023-05-08 VITALS — BP 159/79 | HR 58 | Temp 97.3°F | Resp 20 | Ht 64.0 in | Wt 159.0 lb

## 2023-05-08 DIAGNOSIS — G5601 Carpal tunnel syndrome, right upper limb: Secondary | ICD-10-CM | POA: Diagnosis not present

## 2023-05-08 DIAGNOSIS — G5603 Carpal tunnel syndrome, bilateral upper limbs: Secondary | ICD-10-CM

## 2023-05-08 DIAGNOSIS — G5602 Carpal tunnel syndrome, left upper limb: Secondary | ICD-10-CM

## 2023-05-08 MED ORDER — METHYLPREDNISOLONE ACETATE 40 MG/ML IJ SUSP
20.0000 mg | Freq: Once | INTRAMUSCULAR | Status: AC
  Administered 2023-05-08: 20 mg via INTRAMUSCULAR

## 2023-05-08 MED ORDER — LIDOCAINE HCL 2 % IJ SOLN
0.5000 mL | Freq: Once | INTRAMUSCULAR | Status: AC
  Administered 2023-05-08: 10 mg via INTRADERMAL

## 2023-05-08 NOTE — Patient Instructions (Signed)
Carpal Tunnel Syndrome  Carpal tunnel syndrome is a condition that causes pain, weakness, and numbness in your hand and arm. Numbness is when you cannot feel an area in your body. The carpal tunnel is a narrow area that is on the palm side of your wrist. Repeated wrist motion or certain diseases may cause swelling in the tunnel. This swelling can pinch the main nerve in the wrist. This nerve is called the median nerve. What are the causes? This condition may be caused by: Moving your hand and wrist over and over again while doing a task. Injury to the wrist. Arthritis. A sac of fluid (cyst) or abnormal growth (tumor) in the carpal tunnel. Fluid buildup during pregnancy. Use of tools that vibrate. Sometimes the cause is not known. What increases the risk? The following factors may make you more likely to have this condition: Having a job that makes you do these things: Move your hand over and over again. Work with tools that vibrate, such as drills or sanders. Being a woman. Having diabetes, obesity, thyroid problems, or kidney failure. What are the signs or symptoms? Symptoms of this condition include: A tingling feeling in your fingers. Tingling or loss of feeling in your hand. Pain in your entire arm. This pain may get worse when you bend your wrist and elbow for a long time. Pain in your wrist that goes up your arm to your shoulder. Pain that goes down into your palm or fingers. Weakness in your hands. You may find it hard to grab and hold items. You may feel worse at night. How is this treated? This condition may be treated with: Lifestyle changes. You will be asked to stop or change the activity that caused your problem. Doing exercises and activities that make bones, muscles, and tendons stronger (physical therapy). Learning how to use your hand again (occupational therapy). Medicines for pain and swelling. You may have injections in your wrist. A wrist splint or  brace. Surgery. Follow these instructions at home: If you have a splint or brace: Wear the splint or brace as told by your doctor. Take it off only as told by your doctor. Loosen the splint if your fingers: Tingle. Become numb. Turn cold and blue. Keep the splint or brace clean. If the splint or brace is not waterproof: Do not let it get wet. Cover it with a watertight covering when you take a bath or a shower. Managing pain, stiffness, and swelling If told, put ice on the painful area: If you have a removable splint or brace, remove it as told by your doctor. Put ice in a plastic bag. Place a towel between your skin and the bag. Leave the ice on for 20 minutes, 2-3 times per day. Do not fall asleep with the cold pack on your skin. Take off the ice if your skin turns bright red. This is very important. If you cannot feel pain, heat, or cold, you have a greater risk of damage to the area. Move your fingers often to reduce stiffness and swelling. General instructions Take over-the-counter and prescription medicines only as told by your doctor. Rest your wrist from any activity that may cause pain. If needed, talk with your boss at work about changes that can help your wrist heal. Do exercises as told by your doctor, physical therapist, or occupational therapist. Keep all follow-up visits. Contact a doctor if: You have new symptoms. Medicine does not help your pain. Your symptoms get worse. Get help right  away if: You have very bad numbness or tingling in your wrist or hand. Summary Carpal tunnel syndrome is a condition that causes pain in your hand and arm. It is often caused by repeated wrist motions. Lifestyle changes and medicines are used to treat this problem. Surgery may help in very bad cases. Follow your doctor's instructions about wearing a splint, resting your wrist, keeping follow-up visits, and calling for help. This information is not intended to replace advice given  to you by your health care provider. Make sure you discuss any questions you have with your health care provider. Document Revised: 10/27/2019 Document Reviewed: 10/27/2019 Elsevier Patient Education  2024 [employer].

## 2023-05-08 NOTE — Progress Notes (Signed)
Subjective:    Patient ID: [name], female    DOB: 1951/01/08, 72 y.o.   MRN: [id number]   Chief Complaint: Carpal Tunnel   HPI  Patient comes in today c/o bil carpal tunnel syndrome. She was dx with it years ago and had steroid injections that worked well for her until about 1 month ago when her thumb index and ring fingers gradually started to get numb again an now she is having pain. Is worse at night. Patient Active Problem List   Diagnosis Date Noted   Thrombocytosis 09/29/2017   BMI 28.0-28.9,adult 04/11/2015   Gastroesophageal reflux disease without esophagitis 04/11/2015   Arthritis 02/22/2014   Insomnia 11/30/2013   Hypertension 11/26/2012   Hyperlipidemia 11/26/2012   Hypothyroidism 11/26/2012       Review of Systems  Constitutional:  Negative for diaphoresis.  Eyes:  Negative for pain.  Respiratory:  Negative for shortness of breath.   Cardiovascular:  Negative for chest pain, palpitations and leg swelling.  Gastrointestinal:  Negative for abdominal pain.  Endocrine: Negative for polydipsia.  Skin:  Negative for rash.  Neurological:  Negative for dizziness, weakness and headaches.  Hematological:  Does not bruise/bleed easily.  All other systems reviewed and are negative.      Objective:   Physical Exam Constitutional:      Appearance: Normal appearance.  Musculoskeletal:     Comments: (+) phalen test bil hands  Skin:    General: Skin is warm.  Neurological:     General: No focal deficit present.     Mental Status: She is alert and oriented to person, place, and time.  Psychiatric:        Mood and Affect: Mood normal.        Behavior: Behavior normal.     Joint Injection/Arthrocentesis  Date/Time: 05/08/2023 7:45 AM  Performed by: Bennie Pierini, FNP Authorized by: Bennie Pierini, FNP  Indications: pain  Body area: wrist Joint: right wrist Local anesthesia used: no  Anesthesia: Local anesthesia used:  no  Sedation: Patient sedated: no  Preparation: Patient was prepped and draped in the usual sterile fashion. Needle gauge: 25g. Betamethasone amount: 20 mg Lidocaine 2% amount: 0.5 mL Patient tolerance: patient tolerated the procedure well with no immediate complications   Joint Injection/Arthrocentesis  Date/Time: 05/08/2023 7:46 AM  Performed by: Bennie Pierini, FNP Authorized by: Bennie Pierini, FNP  Indications: pain  Body area: wrist Joint: left wrist Local anesthesia used: no  Anesthesia: Local anesthesia used: no  Sedation: Patient sedated: no  Needle gauge: 25g. Betamethasone amount: 20 mg Lidocaine 2% amount: 0.5 mL Patient tolerance: patient tolerated the procedure well with no immediate complications          Assessment & Plan:  [name] in today with chief complaint of Carpal Tunnel   1. Right carpal tunnel syndrome - methylPREDNISolone acetate (DEPO-MEDROL) injection 20 mg - lidocaine (XYLOCAINE) 2 % (with pres) injection 10 mg  2. Left carpal tunnel syndrome - methylPREDNISolone acetate (DEPO-MEDROL) injection 20 mg - lidocaine (XYLOCAINE) 2 % (with pres) injection 10 mg  Ice bid Rest Wear wrist braces  The above assessment and management plan was discussed with the patient. The patient verbalized understanding of and has agreed to the management plan. Patient is aware to call the clinic if symptoms persist or worsen. Patient is aware when to return to the clinic for a follow-up visit. Patient educated on when it is appropriate to go to the emergency department.   Mary-Margaret Daphine Deutscher, FNP

## 2023-07-02 ENCOUNTER — Other Ambulatory Visit: Payer: Self-pay

## 2023-07-04 ENCOUNTER — Other Ambulatory Visit: Payer: Self-pay | Admitting: Nurse Practitioner

## 2023-07-04 DIAGNOSIS — M1990 Unspecified osteoarthritis, unspecified site: Secondary | ICD-10-CM

## 2023-07-21 ENCOUNTER — Other Ambulatory Visit: Payer: Self-pay | Admitting: *Deleted

## 2023-07-21 DIAGNOSIS — M1990 Unspecified osteoarthritis, unspecified site: Secondary | ICD-10-CM

## 2023-07-21 MED ORDER — MELOXICAM 15 MG PO TABS: 15.0000 mg | ORAL_TABLET | Freq: Every day | ORAL | 0 refills | Status: DC

## 2023-09-02 ENCOUNTER — Other Ambulatory Visit: Payer: Self-pay | Admitting: *Deleted

## 2023-09-02 DIAGNOSIS — M1990 Unspecified osteoarthritis, unspecified site: Secondary | ICD-10-CM

## 2023-09-02 DIAGNOSIS — K219 Gastro-esophageal reflux disease without esophagitis: Secondary | ICD-10-CM

## 2023-09-02 DIAGNOSIS — E7800 Pure hypercholesterolemia, unspecified: Secondary | ICD-10-CM

## 2023-09-02 DIAGNOSIS — E039 Hypothyroidism, unspecified: Secondary | ICD-10-CM

## 2023-09-02 DIAGNOSIS — I10 Essential (primary) hypertension: Secondary | ICD-10-CM

## 2023-09-02 MED ORDER — MELOXICAM 15 MG PO TABS: 15.0000 mg | ORAL_TABLET | Freq: Every day | ORAL | 0 refills | Status: DC

## 2023-09-02 MED ORDER — ATORVASTATIN CALCIUM 40 MG PO TABS: 40.0000 mg | ORAL_TABLET | Freq: Every day | ORAL | 0 refills | Status: DC

## 2023-09-02 MED ORDER — AMLODIPINE BESYLATE 5 MG PO TABS: 5.0000 mg | ORAL_TABLET | Freq: Every day | ORAL | 0 refills | Status: DC

## 2023-09-02 MED ORDER — OMEPRAZOLE 40 MG PO CPDR: 40.0000 mg | DELAYED_RELEASE_CAPSULE | Freq: Every day | ORAL | 0 refills | Status: DC

## 2023-09-02 MED ORDER — LISINOPRIL-HYDROCHLOROTHIAZIDE 20-12.5 MG PO TABS: 2.0000 | ORAL_TABLET | Freq: Every day | ORAL | 0 refills | Status: DC

## 2023-09-02 MED ORDER — LEVOTHYROXINE SODIUM 100 MCG PO TABS: 100.0000 ug | ORAL_TABLET | Freq: Every day | ORAL | 0 refills | Status: DC

## 2023-11-02 ENCOUNTER — Ambulatory Visit (INDEPENDENT_AMBULATORY_CARE_PROVIDER_SITE_OTHER): Payer: No Typology Code available for payment source | Admitting: Nurse Practitioner

## 2023-11-02 ENCOUNTER — Ambulatory Visit (INDEPENDENT_AMBULATORY_CARE_PROVIDER_SITE_OTHER)

## 2023-11-02 ENCOUNTER — Encounter: Payer: Self-pay | Admitting: Nurse Practitioner

## 2023-11-02 VITALS — BP 134/74 | HR 54 | Temp 97.3°F | Ht 64.0 in | Wt 153.0 lb

## 2023-11-02 DIAGNOSIS — M1990 Unspecified osteoarthritis, unspecified site: Secondary | ICD-10-CM

## 2023-11-02 DIAGNOSIS — K219 Gastro-esophageal reflux disease without esophagitis: Secondary | ICD-10-CM

## 2023-11-02 DIAGNOSIS — I10 Essential (primary) hypertension: Secondary | ICD-10-CM

## 2023-11-02 DIAGNOSIS — Z139 Encounter for screening, unspecified: Secondary | ICD-10-CM | POA: Diagnosis not present

## 2023-11-02 DIAGNOSIS — E7800 Pure hypercholesterolemia, unspecified: Secondary | ICD-10-CM | POA: Diagnosis not present

## 2023-11-02 DIAGNOSIS — R739 Hyperglycemia, unspecified: Secondary | ICD-10-CM

## 2023-11-02 DIAGNOSIS — M67432 Ganglion, left wrist: Secondary | ICD-10-CM

## 2023-11-02 DIAGNOSIS — E039 Hypothyroidism, unspecified: Secondary | ICD-10-CM | POA: Diagnosis not present

## 2023-11-02 DIAGNOSIS — Z6828 Body mass index (BMI) 28.0-28.9, adult: Secondary | ICD-10-CM

## 2023-11-02 DIAGNOSIS — D75839 Thrombocytosis, unspecified: Secondary | ICD-10-CM

## 2023-11-02 DIAGNOSIS — F5101 Primary insomnia: Secondary | ICD-10-CM

## 2023-11-02 DIAGNOSIS — Z0001 Encounter for general adult medical examination with abnormal findings: Secondary | ICD-10-CM | POA: Diagnosis not present

## 2023-11-02 DIAGNOSIS — Z0000 Encounter for general adult medical examination without abnormal findings: Secondary | ICD-10-CM

## 2023-11-02 LAB — LIPID PANEL

## 2023-11-02 MED ORDER — ATORVASTATIN CALCIUM 40 MG PO TABS
40.0000 mg | ORAL_TABLET | Freq: Every day | ORAL | 1 refills | Status: DC
Start: 2023-11-02 — End: 2024-05-03

## 2023-11-02 MED ORDER — FERROUS SULFATE 325 (65 FE) MG PO TBEC: 325.0000 mg | DELAYED_RELEASE_TABLET | Freq: Two times a day (BID) | ORAL | 1 refills | Status: DC

## 2023-11-02 MED ORDER — OMEPRAZOLE 40 MG PO CPDR: 40.0000 mg | DELAYED_RELEASE_CAPSULE | Freq: Every day | ORAL | 1 refills | Status: DC

## 2023-11-02 MED ORDER — AMLODIPINE BESYLATE 5 MG PO TABS: 5.0000 mg | ORAL_TABLET | Freq: Every day | ORAL | 1 refills | Status: DC

## 2023-11-02 MED ORDER — LISINOPRIL-HYDROCHLOROTHIAZIDE 20-12.5 MG PO TABS: 2.0000 | ORAL_TABLET | Freq: Every day | ORAL | 1 refills | Status: DC

## 2023-11-02 MED ORDER — LEVOTHYROXINE SODIUM 100 MCG PO TABS: 100.0000 ug | ORAL_TABLET | Freq: Every day | ORAL | 1 refills | Status: DC

## 2023-11-02 MED ORDER — MELOXICAM 15 MG PO TABS: 15.0000 mg | ORAL_TABLET | Freq: Every day | ORAL | 1 refills | Status: DC

## 2023-11-02 NOTE — Progress Notes (Signed)
 Subjective:    Patient ID: Gabriela Gardner, female    DOB: 1951-02-28, 73 y.o.   MRN: [id number]   Chief Complaint: annual physical    HPI:  Gabriela Gardner is a 73 y.o. who identifies as a female who was assigned female at birth.   Social history: Lives with: husband Work history: retired   Water engineer in today for follow up of the following chronic medical issues:  1. Primary hypertension No c/o chest pain, sob or headache. Does not check blood pressure at home. Has not taken meds yet today. BP Readings from Last 3 Encounters:  05/08/23 (!) 159/79  05/04/23 (!) 147/82  10/31/22 134/79     2. Pure hypercholesterolemia Does try to watch diet and stays very active Lab Results  Component Value Date   CHOL 182 05/04/2023   HDL 51 05/04/2023   LDLCALC 82 05/04/2023   TRIG 304 (H) 05/04/2023   CHOLHDL 3.6 05/04/2023    The 82-NFAO ASCVD risk score (Arnett DK, et al., 2019) is: 27.6%  3. Acquired hypothyroidism No issues  that she is aware of. Lab Results  Component Value Date   TSH 1.580 05/04/2023     4. Gastroesophageal reflux disease without esophagitis Omperazole works well for her on a daily basis.   5. Primary insomnia Better, she takes 2 extra strength tylenol  6. BMI 27.0-27.9,adult Weight is down 6lbs  Wt Readings from Last 3 Encounters:  11/02/23 153 lb (69.4 kg)  05/08/23 159 lb (72.1 kg)  05/04/23 159 lb (72.1 kg)   BMI Readings from Last 3 Encounters:  11/02/23 26.26 kg/m  05/08/23 27.29 kg/m  05/04/23 27.29 kg/m     7. thrombocytosis Denis any bleeding issues. Platlets were normal at last check Lab Results  Component Value Date   WBC 5.9 05/04/2023   HGB 13.9 05/04/2023   HCT 43.2 05/04/2023   MCV 97 05/04/2023   PLT 253 05/04/2023      New complaints: Ganglion cyst on dorsal surface of left hand. Is starting to cause pain and numbness distally  Allergies  Allergen Reactions   Codeine Rash   Outpatient Encounter Medications  as of 11/02/2023  Medication Sig   amLODipine  (NORVASC ) 5 MG tablet Take 1 tablet (5 mg total) by mouth daily.   Ascorbic Acid (VITAMIN C PO) Take by mouth.   atorvastatin  (LIPITOR) 40 MG tablet Take 1 tablet (40 mg total) by mouth daily.   calcium  carbonate (OS-CAL) 600 MG TABS Take 600 mg by mouth daily.   Cyanocobalamin (B-12 PO) Take by mouth.   ferrous sulfate  325 (65 FE) MG EC tablet Take 1 tablet (325 mg total) by mouth 2 (two) times daily before a meal.   levothyroxine  (SYNTHROID ) 100 MCG tablet Take 1 tablet (100 mcg total) by mouth daily.   lisinopril -hydrochlorothiazide  (ZESTORETIC ) 20-12.5 MG tablet Take 2 tablets by mouth daily.   meloxicam  (MOBIC ) 15 MG tablet Take 1 tablet (15 mg total) by mouth daily.   Multiple Vitamin (MULTIVITAMIN) tablet Take 1 tablet by mouth daily.   omeprazole  (PRILOSEC) 40 MG capsule Take 1 capsule (40 mg total) by mouth daily.   No facility-administered encounter medications on file as of 11/02/2023.    Past Surgical History:  Procedure Laterality Date   ABDOMINAL HYSTERECTOMY     SKIN LESION EXCISION      Family History  Adopted: Yes  Problem Relation Age of Onset   Turner syndrome Daughter    Asthma Son    Breast cancer  Neg Hx       Controlled substance contract: n/a     Review of Systems  Constitutional:  Negative for diaphoresis.  Eyes:  Negative for pain.  Respiratory:  Negative for shortness of breath.   Cardiovascular:  Negative for chest pain, palpitations and leg swelling.  Gastrointestinal:  Negative for abdominal pain.  Endocrine: Negative for polydipsia.  Skin:  Negative for rash.  Neurological:  Negative for dizziness, weakness and headaches.  Hematological:  Does not bruise/bleed easily.  All other systems reviewed and are negative.      Objective:   Physical Exam Vitals and nursing note reviewed.  Constitutional:      General: She is not in acute distress.    Appearance: Normal appearance. She is  well-developed.  HENT:     Head: Normocephalic.     Right Ear: Tympanic membrane normal.     Left Ear: Tympanic membrane normal.     Nose: Nose normal.     Mouth/Throat:     Mouth: Mucous membranes are moist.  Eyes:     Pupils: Pupils are equal, round, and reactive to light.  Neck:     Vascular: No carotid bruit or JVD.  Cardiovascular:     Rate and Rhythm: Normal rate and regular rhythm.     Heart sounds: Normal heart sounds.  Pulmonary:     Effort: Pulmonary effort is normal. No respiratory distress.     Breath sounds: Normal breath sounds. No wheezing or rales.  Chest:     Chest wall: No tenderness.  Abdominal:     General: Bowel sounds are normal. There is no distension or abdominal bruit.     Palpations: Abdomen is soft. There is no hepatomegaly, splenomegaly, mass or pulsatile mass.     Tenderness: There is no abdominal tenderness.  Musculoskeletal:        General: Normal range of motion.     Cervical back: Normal range of motion and neck supple.  Lymphadenopathy:     Cervical: No cervical adenopathy.  Skin:    General: Skin is warm and dry.     Comments: Raised fluid filled sac on dorsal surface of left hand  Neurological:     Mental Status: She is alert and oriented to person, place, and time.     Deep Tendon Reflexes: Reflexes are normal and symmetric.  Psychiatric:        Behavior: Behavior normal.        Thought Content: Thought content normal.        Judgment: Judgment normal.     BP 134/74   Pulse (!) 54   Temp (!) 97.3 F (36.3 C) (Temporal)   Ht 5\' 4"  (1.626 m)   Wt 153 lb (69.4 kg)   SpO2 99%   BMI 26.26 kg/m   EKG- NSR-Mary-Margaret Gaylyn Keas, FNP  Chest xray- all clear- Preliminary reading by Irvine Mantis, FNP  [facility]        Assessment & Plan:  Gabriela Gardner comes in today with chief complaint of annual physical  Diagnosis and orders addressed:  1. Primary hypertension Low sodium diet - amLODipine  (NORVASC ) 5 MG tablet; Take 1 tablet (5 mg  total) by mouth daily.  Dispense: 90 tablet; Refill: 1 - lisinopril -hydrochlorothiazide  (ZESTORETIC ) 20-12.5 MG tablet; Take 2 tablets by mouth daily.  Dispense: 180 tablet; Refill: 1 - CBC with Differential/Platelet - CMP14+EGFR  2. Pure hypercholesterolemia Low fat diet - atorvastatin  (LIPITOR) 40 MG tablet; Take 1 tablet (40 mg total) by  mouth daily.  Dispense: 90 tablet; Refill: 1 - Lipid panel  3. Acquired hypothyroidism - lab pending - levothyroxine  (SYNTHROID ) 100 MCG tablet; Take 1 tablet (100 mcg total) by mouth daily.  Dispense: 90 tablet; Refill: 1 - Thyroid  Panel With TSH  4. Gastroesophageal reflux disease without esophagitis Avoid spicy foods Do not eat 2 hours prior to bedtime - omeprazole  (PRILOSEC) 40 MG capsule; Take 1 capsule (40 mg total) by mouth daily.  Dispense: 90 capsule; Refill: 1  5. Primary insomnia Bedtime routine  6. BMI 28.0-28.9,adult Discussed diet and exercise for person with BMI >25 Will recheck weight in 3-6 months   7. Thrombocytosis Labs pending - ferrous sulfate  325 (65 FE) MG EC tablet; Take 1 tablet (325 mg total) by mouth 2 (two) times daily before a meal.  Dispense: 90 tablet; Refill: 1  8. Ganglion cyst left wrist Referral made to ortho  Labs pending Health Maintenance reviewed Diet and exercise encouraged  Follow up plan: 6 months   Mary-Margaret Gaylyn Keas, FNP

## 2023-11-02 NOTE — Patient Instructions (Signed)
 Draining a Pocket of Fluid at a Joint (Ganglion Cyst Drainage): What to Expect A pocket of fluid near a joint is called a ganglion cyst. These cysts often form on joints in your hand or wrist. They can also form: On tendons, which are tissues that connect muscle to bone. On ligaments, which are tissues that connect bones to each other. On joints in your shoulder, elbow, hip, knee, ankle, or foot. You may need to have the fluid drained from the cyst with a needle. This procedure is called aspiration. You may need it if: The cyst is very big. The cyst hurts or makes it hard for you to move. The cyst pushes on a nerve. This can cause tingling or weakness. If the cyst keeps coming back, you may need surgery to have it removed. Tell a health care provider about: Any allergies you have. All medicines you take. These include vitamins, herbs, eye drops, and creams. Any problems you or family members have had with anesthesia. Any bleeding problems you have. Any surgeries you've had. Any medical conditions you have. Whether you're pregnant or may be pregnant. What are the risks? Your health care provider will talk with you about risks. These may include: The cyst coming back. Feeling stiff or not being able to move as well as you should. Damage to nearby structures. These include tendons, nerves, and blood vessels. Infection. Bleeding. Allergic reactions to medicines. What happens before the procedure Medicines Ask about changing or stopping: Any medicines you take. Any vitamins, herbs, or supplements you take. Do not take aspirin or ibuprofen unless you're told to. Tests You may have tests. These may include: An X-ray. An MRI. A CT scan. An ultrasound. General instructions Ask your provider what steps will be taken to prevent infection. Plan to have a responsible adult: Drive you home from the hospital or clinic. You won't be allowed to drive. Stay with you for the time you're  told. What happens during the procedure An IV will be put into a vein in your hand or arm. You may be given: A sedative to help you relax. Anesthesia to keep you from feeling pain. A needle with a syringe will be put into the cyst. An ultrasound may be used to help guide the needle. Fluid from the cyst will be drawn into the syringe. Pressure may be applied to get all of the fluid out of the cyst. Medicine may be put into the cyst. This can help with irritation and swelling. The needle will be taken out. A small bandage will be put over the spot where the needle went in. These steps may vary. Ask what you can expect. What can I expect after the procedure You'll be able to go home right after the procedure. You may have some: Soreness. Swelling. Stiffness. You may have to wear a brace. Follow these instructions at home: Medicines If you were given a sedative, do not drive or use machines until you're told it's safe. A sedative can make you sleepy. Take your medicines only as told. If you have a brace: Wear it as told by your provider. Take it off only if your provider says you can. Check the skin around it every day. Tell your provider if you see problems. Loosen the brace if your fingers or toes tingle, are numb, or turn cold and blue. Keep the brace clean and dry. Bathing Do not take baths, swim, or use a hot tub until you're told it's OK. Ask if you  can shower. If you have a brace that isn't waterproof: Do not let it get wet. Ask if you can take it off to bathe. If not, cover it when you take a bath or shower. Use a cover that doesn't let any water in. Puncture site care  Take off your bandage when your provider says it's OK. Check the area around your puncture site every day for signs of infection. Check for: Redness, swelling, or pain. Fluid or blood. Warmth. Pus or a bad smell. Managing pain, stiffness, and swelling  Use ice or an ice pack as told. If you have a  brace that you can take off, remove it only as told. Place a towel between your skin and the ice. Leave the ice on for 20 minutes, 2-3 times a day. If your skin turns red, take off the ice right away to prevent skin damage. The risk of damage is higher if you can't feel pain, heat, or cold. Move your fingers or toes often to reduce stiffness and swelling. Raise your affected body part above the level of your heart while you're sitting or lying down. Use pillows as needed. Activity Avoid doing things that cause pain. Ask your provider: When it's safe for you to drive. When you should start doing movement exercises. What things are safe for you to do at home. When you can go back to work or school. Where to find more information American Society for [employer] of the Hand (ASSH): assh.org Contact a health care provider if: Your pain doesn't get better with medicine. Your stiffness or swelling gets worse. Your brace doesn't fit right. You have any signs of infection. You have a fever. This information is not intended to replace advice given to you by your health care provider. Make sure you discuss any questions you have with your health care provider. Document Revised: 12/11/2022 Document Reviewed: 12/11/2022 Elsevier Patient Education  2024 [employer].

## 2023-11-03 LAB — CBC WITH DIFFERENTIAL/PLATELET
Basophils Absolute: 0.1 10*3/uL (ref 0.0–0.2)
Basos: 1 %
EOS (ABSOLUTE): 0.2 10*3/uL (ref 0.0–0.4)
Eos: 3 %
Hematocrit: 41.5 % (ref 34.0–46.6)
Hemoglobin: 14 g/dL (ref 11.1–15.9)
Immature Grans (Abs): 0 10*3/uL (ref 0.0–0.1)
Immature Granulocytes: 0 %
Lymphocytes Absolute: 1 10*3/uL (ref 0.7–3.1)
Lymphs: 17 %
MCH: 31.7 pg (ref 26.6–33.0)
MCHC: 33.7 g/dL (ref 31.5–35.7)
MCV: 94 fL (ref 79–97)
Monocytes Absolute: 0.5 10*3/uL (ref 0.1–0.9)
Monocytes: 8 %
Neutrophils Absolute: 4.3 10*3/uL (ref 1.4–7.0)
Neutrophils: 71 %
Platelets: 240 10*3/uL (ref 150–450)
RBC: 4.41 x10E6/uL (ref 3.77–5.28)
RDW: 11.6 % — ABNORMAL LOW (ref 11.7–15.4)
WBC: 6 10*3/uL (ref 3.4–10.8)

## 2023-11-03 LAB — CMP14+EGFR
ALT: 27 IU/L (ref 0–32)
AST: 28 IU/L (ref 0–40)
Albumin: 5 g/dL — ABNORMAL HIGH (ref 3.8–4.8)
Alkaline Phosphatase: 68 IU/L (ref 44–121)
BUN/Creatinine Ratio: 16 (ref 12–28)
BUN: 16 mg/dL (ref 8–27)
Bilirubin Total: 0.6 mg/dL (ref 0.0–1.2)
CO2: 19 mmol/L — ABNORMAL LOW (ref 20–29)
Calcium: 10.1 mg/dL (ref 8.7–10.3)
Chloride: 98 mmol/L (ref 96–106)
Creatinine, Ser: 0.97 mg/dL (ref 0.57–1.00)
Globulin, Total: 2.5 g/dL (ref 1.5–4.5)
Glucose: 110 mg/dL — ABNORMAL HIGH (ref 70–99)
Potassium: 4.9 mmol/L (ref 3.5–5.2)
Sodium: 138 mmol/L (ref 134–144)
Total Protein: 7.5 g/dL (ref 6.0–8.5)
eGFR: 61 mL/min/{1.73_m2} (ref >59)

## 2023-11-03 LAB — LIPID PANEL
Cholesterol, Total: 170 mg/dL (ref 100–199)
HDL: 52 mg/dL (ref >39)
LDL CALC COMMENT:: 3.3 ratio (ref 0.0–4.4)
LDL Chol Calc (NIH): 72 mg/dL (ref 0–99)
Triglycerides: 286 mg/dL — ABNORMAL HIGH (ref 0–149)
VLDL Cholesterol Cal: 46 mg/dL — ABNORMAL HIGH (ref 5–40)

## 2023-11-03 LAB — THYROID PANEL WITH TSH
Free Thyroxine Index: 2.4 (ref 1.2–4.9)
T3 Uptake Ratio: 26 % (ref 24–39)
T4, Total: 9.3 ug/dL (ref 4.5–12.0)
TSH: 1.43 u[IU]/mL (ref 0.450–4.500)

## 2023-11-03 NOTE — Addendum Note (Signed)
 Addended by: Remiel Corti, MARY-MARGARET on: 11/03/2023 12:46 PM   Modules accepted: Orders

## 2023-11-04 LAB — HGB A1C W/O EAG: Hgb A1c MFr Bld: 5.9 % — ABNORMAL HIGH (ref 4.8–5.6)

## 2023-11-04 LAB — SPECIMEN STATUS REPORT

## 2023-11-05 ENCOUNTER — Encounter

## 2023-11-13 ENCOUNTER — Other Ambulatory Visit: Payer: Self-pay | Admitting: Nurse Practitioner

## 2023-11-13 DIAGNOSIS — M67432 Ganglion, left wrist: Secondary | ICD-10-CM

## 2023-12-22 DIAGNOSIS — G5601 Carpal tunnel syndrome, right upper limb: Secondary | ICD-10-CM | POA: Diagnosis not present

## 2024-01-19 ENCOUNTER — Ambulatory Visit

## 2024-01-27 DIAGNOSIS — G629 Polyneuropathy, unspecified: Secondary | ICD-10-CM | POA: Diagnosis not present

## 2024-01-27 DIAGNOSIS — G5620 Lesion of ulnar nerve, unspecified upper limb: Secondary | ICD-10-CM | POA: Diagnosis not present

## 2024-02-09 ENCOUNTER — Other Ambulatory Visit: Payer: Self-pay | Admitting: Nurse Practitioner

## 2024-02-09 DIAGNOSIS — Z1231 Encounter for screening mammogram for malignant neoplasm of breast: Secondary | ICD-10-CM

## 2024-02-16 ENCOUNTER — Ambulatory Visit

## 2024-02-16 VITALS — BP 134/74 | HR 54 | Ht 64.0 in | Wt 153.0 lb

## 2024-02-16 DIAGNOSIS — Z0000 Encounter for general adult medical examination without abnormal findings: Secondary | ICD-10-CM | POA: Diagnosis not present

## 2024-02-16 NOTE — Patient Instructions (Addendum)
 Ms. Kuper , Thank you for taking time out of your busy schedule to complete your Annual Wellness Visit with me. I enjoyed our conversation and look forward to speaking with you again next year. I, as well as your care team,  appreciate your ongoing commitment to your health goals. Please review the following plan we discussed and let me know if I can assist you in the future. Your Game plan/ To Do List    Referrals: If you haven't heard from the office you've been referred to, please reach out to them at the phone provided.   Follow up Visits: We will see or speak with you next year for your Next Medicare AWV with our clinical staff on 02/16/25 at 8:00a.m. Have you seen your provider in the last 6 months (3 months if uncontrolled diabetes)? Yes  Clinician Recommendations:  Aim for 30 minutes of exercise or brisk walking, 6-8 glasses of water, and 5 servings of fruits and vegetables each day.       This is a list of the screenings recommended for you:  Health Maintenance  Topic Date Due   COVID-19 Vaccine (8 - 2024-25 season) 05/12/2023   Medicare Annual Wellness Visit  11/04/2023   Flu Shot  01/29/2024   Colon Cancer Screening  05/03/2024*   Mammogram  03/24/2024   DEXA scan (bone density measurement)  10/30/2024   DTaP/Tdap/Td vaccine (2 - Td or Tdap) 10/30/2031   Pneumococcal Vaccine for age over 31  Completed   Hepatitis C Screening  Completed   Zoster (Shingles) Vaccine  Completed   HPV Vaccine  Aged Out   Meningitis B Vaccine  Aged Out   Pneumococcal Vaccine  Discontinued  *Topic was postponed. The date shown is not the original due date.    Advanced directives: (Declined) Advance directive discussed with you today. Even though you declined this today, please call our office should you change your mind, and we can give you the proper paperwork for you to fill out. Advance Care Planning is important because it:  [x]  Makes sure you receive the medical care that is consistent  with your values, goals, and preferences  [x]  It provides guidance to your family and loved ones and reduces their decisional burden about whether or not they are making the right decisions based on your wishes.  Follow the link provided in your after visit summary or read over the paperwork we have mailed to you to help you started getting your Advance Directives in place. If you need assistance in completing these, please reach out to us  so that we can help you!  See attachments for Preventive Care and Fall Prevention Tips.

## 2024-02-16 NOTE — Progress Notes (Signed)
 Subjective:   Gabriela Gardner is a 73 y.o. who presents for a Medicare Wellness preventive visit.  As a reminder, Annual Wellness Visits don't include a physical exam, and some assessments may be limited, especially if this visit is performed virtually. We may recommend an in-person follow-up visit with your provider if needed.  Visit Complete: Virtual I connected with  Gabriela Gardner on 02/16/24 by a audio enabled telemedicine application and verified that I am speaking with the correct person using two identifiers.  Patient Location: Home  Provider Location: Home Office  I discussed the limitations of evaluation and management by telemedicine. The patient expressed understanding and agreed to proceed.  Vital Signs: Because this visit was a virtual/telehealth visit, some criteria may be missing or patient reported. Any vitals not documented were not able to be obtained and vitals that have been documented are patient reported.  VideoDeclined- This patient declined Librarian, academic. Therefore the visit was completed with audio only.  Persons Participating in Visit: Patient.  AWV Questionnaire: No: Patient Medicare AWV questionnaire was not completed prior to this visit.  Cardiac Risk Factors include: advanced age (>74men, >69 women);dyslipidemia;hypertension     Objective:    Today's Vitals   02/16/24 0805  BP: 134/74  Pulse: (!) 54  Weight: 153 lb (69.4 kg)  Height: 5' 4 (1.626 m)   Body mass index is 26.26 kg/m.     02/16/2024    8:10 AM 11/04/2022    2:28 PM 04/11/2022    1:58 PM 03/11/2021    2:19 PM 09/16/2019    8:51 AM 11/11/2017    1:56 PM 09/29/2017    1:48 PM  Advanced Directives  Does Patient Have a Medical Advance Directive? No No No No No No  No   Would patient like information on creating a medical advance directive?  No - Patient declined No - Patient declined No - Patient declined No - Patient declined No - Patient declined  No -  Patient declined      Data saved with a previous flowsheet row definition    Current Medications (verified) Outpatient Encounter Medications as of 02/16/2024  Medication Sig   amLODipine  (NORVASC ) 5 MG tablet Take 1 tablet (5 mg total) by mouth daily.   Ascorbic Acid (VITAMIN C PO) Take by mouth.   atorvastatin  (LIPITOR) 40 MG tablet Take 1 tablet (40 mg total) by mouth daily.   calcium  carbonate (OS-CAL) 600 MG TABS Take 600 mg by mouth daily.   ferrous sulfate  325 (65 FE) MG EC tablet Take 1 tablet (325 mg total) by mouth 2 (two) times daily before a meal.   levothyroxine  (SYNTHROID ) 100 MCG tablet Take 1 tablet (100 mcg total) by mouth daily.   lisinopril -hydrochlorothiazide  (ZESTORETIC ) 20-12.5 MG tablet Take 2 tablets by mouth daily.   meloxicam  (MOBIC ) 15 MG tablet Take 1 tablet (15 mg total) by mouth daily.   Multiple Vitamin (MULTIVITAMIN) tablet Take 1 tablet by mouth daily.   omeprazole  (PRILOSEC) 40 MG capsule Take 1 capsule (40 mg total) by mouth daily.   No facility-administered encounter medications on file as of 02/16/2024.    Allergies (verified) Codeine   History: Past Medical History:  Diagnosis Date   Hyperlipidemia    Hypertension    Skin cancer (melanoma) (HCC)    left upper arm   Thyroid  disease    Past Surgical History:  Procedure Laterality Date   ABDOMINAL HYSTERECTOMY     SKIN LESION EXCISION  Family History  Adopted: Yes  Problem Relation Age of Onset   Turner syndrome Daughter    Asthma Son    Breast cancer Neg Hx    Social History   Socioeconomic History   Marital status: Married    Spouse name: Lamar   Number of children: 2   Years of education: 12   Highest education level: 12th grade  Occupational History   Occupation: retired  Tobacco Use   Smoking status: Former    Current packs/day: 0.00    Average packs/day: 1 pack/day for 30.0 years (30.0 ttl pk-yrs)    Types: Cigarettes    Start date: 06/30/1976    Quit date: 06/30/2006     Years since quitting: 17.6   Smokeless tobacco: Never  Vaping Use   Vaping status: Never Used  Substance and Sexual Activity   Alcohol use: Yes    Comment: occasional   Drug use: No   Sexual activity: Yes    Birth control/protection: Post-menopausal  Other Topics Concern   Not on file  Social History Narrative   Not on file   Social Drivers of Health   Financial Resource Strain: Low Risk  (02/16/2024)   Overall Financial Resource Strain (CARDIA)    Difficulty of Paying Living Expenses: Not hard at all  Food Insecurity: No Food Insecurity (02/16/2024)   Hunger Vital Sign    Worried About Running Out of Food in the Last Year: Never true    Ran Out of Food in the Last Year: Never true  Transportation Needs: No Transportation Needs (02/16/2024)   PRAPARE - Administrator, Civil Service (Medical): No    Lack of Transportation (Non-Medical): No  Physical Activity: Sufficiently Active (02/16/2024)   Exercise Vital Sign    Days of Exercise per Week: 5 days    Minutes of Exercise per Session: 30 min  Stress: No Stress Concern Present (02/16/2024)   [employer] of Occupational Health - Occupational Stress Questionnaire    Feeling of Stress: Not at all  Social Connections: Moderately Integrated (02/16/2024)   Social Connection and Isolation Panel    Frequency of Communication with Friends and Family: More than three times a week    Frequency of Social Gatherings with Friends and Family: Once a week    Attends Religious Services: More than 4 times per year    Active Member of [employer] or Organizations: No    Attends Engineer, structural: Never    Marital Status: Married    Tobacco Counseling Counseling given: Yes    Clinical Intake:  Pre-visit preparation completed: Yes  Pain : No/denies pain     BMI - recorded: 26.26 Nutritional Status: BMI 25 -29 Overweight Nutritional Risks: None Diabetes: No  Lab Results  Component Value Date   HGBA1C 5.9  (H) 11/02/2023     How often do you need to have someone help you when you read instructions, pamphlets, or other written materials from your doctor or pharmacy?: 1 - Never  Interpreter Needed?: No  Information entered by :: alia t/cma   Activities of Daily Living     02/16/2024    8:09 AM  In your present state of health, do you have any difficulty performing the following activities:  Hearing? 0  Vision? 0  Difficulty concentrating or making decisions? 0  Walking or climbing stairs? 0  Dressing or bathing? 0  Doing errands, shopping? 0  Preparing Food and eating ? N  Using the Toilet? N  In the past six months, have you accidently leaked urine? N  Do you have problems with loss of bowel control? N  Managing your Medications? N  Managing your Finances? N  Housekeeping or managing your Housekeeping? N    Patient Care Team: Gladis Mustard, FNP as PCP - General (Nurse Practitioner)  I have updated your Care Teams any recent Medical Services you may have received from other providers in the past year.     Assessment:   This is a routine wellness examination for Gabriela Gardner.  Hearing/Vision screen Hearing Screening - Comments:: Pt denies hearing dif Vision Screening - Comments:: Pt wear glasses for distance/pt goes to [facility], upcoming apt in 10/25   Goals Addressed             This Visit's Progress    Exercise 150 min/wk Moderate Activity   On track      Depression Screen     02/16/2024    8:13 AM 11/02/2023    8:34 AM 05/08/2023    8:07 AM 05/04/2023    8:03 AM 11/04/2022    2:28 PM 10/31/2022    8:03 AM 05/02/2022    8:26 AM  PHQ 2/9 Scores  PHQ - 2 Score 0 0 0 0 0 0 0  PHQ- 9 Score   0 0 0 0 0    Fall Risk     02/16/2024    8:07 AM 11/02/2023    8:34 AM 05/08/2023    8:07 AM 05/04/2023    8:03 AM 11/04/2022    2:27 PM  Fall Risk   Falls in the past year? 0 0 0 0 0  Number falls in past yr: 0    0  Injury with Fall? 0    0  Risk for fall due  to : No Fall Risks    No Fall Risks  Follow up Falls evaluation completed;Education provided    Falls prevention discussed    MEDICARE RISK AT HOME:  Medicare Risk at Home Any stairs in or around the home?: Yes If so, are there any without handrails?: Yes Home free of loose throw rugs in walkways, pet beds, electrical cords, etc?: Yes Adequate lighting in your home to reduce risk of falls?: Yes Life alert?: No Use of a cane, walker or w/c?: No Grab bars in the bathroom?: No Shower chair or bench in shower?: Yes Elevated toilet seat or a handicapped toilet?: Yes  TIMED UP AND GO:  Was the test performed?  no  Cognitive Function: 6CIT completed        02/16/2024    8:10 AM 11/04/2022    2:29 PM 04/11/2022    1:59 PM 09/16/2019    8:54 AM  6CIT Screen  What Year? 0 points 0 points 0 points 0 points  What month? 0 points 0 points 0 points 0 points  What time? 0 points 0 points 0 points 0 points  Count back from 20 0 points 0 points 0 points 0 points  Months in reverse 0 points 0 points 0 points 0 points  Repeat phrase 0 points 0 points 0 points 0 points  Total Score 0 points 0 points 0 points 0 points    Immunizations Immunization History  Administered Date(s) Administered   Fluad Quad(high Dose 65+) 03/24/2021   Influenza, High Dose Seasonal PF 04/01/2016   Influenza,inj,Quad PF,6+ Mos 09/07/2014, 04/11/2015   Influenza-Unspecified 03/13/2017, 03/01/2019, 03/29/2020, 03/14/2022, 03/17/2023   Moderna SARS-COV2 Booster Vaccination 05/03/2020, 01/28/2021, 04/17/2021,  03/20/2022, 03/17/2023   Moderna Sars-Covid-2 Vaccination 07/23/2019, 08/20/2019   Pneumococcal Conjugate-13 12/27/2014   Pneumococcal Polysaccharide-23 09/19/2016   Tdap 10/29/2021   Zoster Recombinant(Shingrix ) 10/29/2021, 05/02/2022   Zoster, Live 02/22/2014    Screening Tests Health Maintenance  Topic Date Due   COVID-19 Vaccine (8 - 2024-25 season) 05/12/2023   INFLUENZA VACCINE  01/29/2024    Colonoscopy  05/03/2024 (Originally 01/19/2023)   MAMMOGRAM  03/24/2024   DEXA SCAN  10/30/2024   Medicare Annual Wellness (AWV)  02/15/2025   DTaP/Tdap/Td (2 - Td or Tdap) 10/30/2031   Pneumococcal Vaccine: 50+ Years  Completed   Hepatitis C Screening  Completed   Zoster Vaccines- Shingrix   Completed   HPV VACCINES  Aged Out   Meningococcal B Vaccine  Aged Out   Pneumococcal Vaccine  Discontinued    Health Maintenance  Health Maintenance Due  Topic Date Due   COVID-19 Vaccine (8 - 2024-25 season) 05/12/2023   INFLUENZA VACCINE  01/29/2024   Health Maintenance Items Addressed: See Nurse Notes at the end of this note  Additional Screening:  Vision Screening: Recommended annual ophthalmology exams for early detection of glaucoma and other disorders of the eye. Would you like a referral to an eye doctor? No    Dental Screening: Recommended annual dental exams for proper oral hygiene  Community Resource Referral / Chronic Care Management: CRR required this visit?  No   CCM required this visit?  No   Plan:    I have personally reviewed and noted the following in the patient's chart:   Medical and social history Use of alcohol, tobacco or illicit drugs  Current medications and supplements including opioid prescriptions. Patient is not currently taking opioid prescriptions. Functional ability and status Nutritional status Physical activity Advanced directives List of other physicians Hospitalizations, surgeries, and ER visits in previous 12 months Vitals Screenings to include cognitive, depression, and falls Referrals and appointments  In addition, I have reviewed and discussed with patient certain preventive protocols, quality metrics, and best practice recommendations. A written personalized care plan for preventive services as well as general preventive health recommendations were provided to patient.   Gabriela Gardner, CMA   02/16/2024   After Visit Summary:  (MyChart) Due to this being a telephonic visit, the after visit summary with patients personalized plan was offered to patient via MyChart   Notes: Nothing significant to report at this time.

## 2024-02-23 DIAGNOSIS — M25561 Pain in right knee: Secondary | ICD-10-CM | POA: Diagnosis not present

## 2024-03-04 DIAGNOSIS — M25561 Pain in right knee: Secondary | ICD-10-CM | POA: Diagnosis not present

## 2024-03-14 DIAGNOSIS — G5603 Carpal tunnel syndrome, bilateral upper limbs: Secondary | ICD-10-CM | POA: Diagnosis not present

## 2024-03-15 DIAGNOSIS — Z85820 Personal history of malignant melanoma of skin: Secondary | ICD-10-CM | POA: Diagnosis not present

## 2024-03-15 DIAGNOSIS — L570 Actinic keratosis: Secondary | ICD-10-CM | POA: Diagnosis not present

## 2024-03-15 DIAGNOSIS — L905 Scar conditions and fibrosis of skin: Secondary | ICD-10-CM | POA: Diagnosis not present

## 2024-03-15 DIAGNOSIS — L821 Other seborrheic keratosis: Secondary | ICD-10-CM | POA: Diagnosis not present

## 2024-03-15 DIAGNOSIS — D225 Melanocytic nevi of trunk: Secondary | ICD-10-CM | POA: Diagnosis not present

## 2024-04-11 ENCOUNTER — Ambulatory Visit
Admission: RE | Admit: 2024-04-11 | Discharge: 2024-04-11 | Disposition: A | Source: Ambulatory Visit | Attending: Nurse Practitioner | Admitting: Nurse Practitioner

## 2024-04-11 DIAGNOSIS — Z1231 Encounter for screening mammogram for malignant neoplasm of breast: Secondary | ICD-10-CM

## 2024-04-22 DIAGNOSIS — H25813 Combined forms of age-related cataract, bilateral: Secondary | ICD-10-CM | POA: Diagnosis not present

## 2024-04-22 DIAGNOSIS — H527 Unspecified disorder of refraction: Secondary | ICD-10-CM | POA: Diagnosis not present

## 2024-04-22 DIAGNOSIS — H02831 Dermatochalasis of right upper eyelid: Secondary | ICD-10-CM | POA: Diagnosis not present

## 2024-04-22 DIAGNOSIS — H02834 Dermatochalasis of left upper eyelid: Secondary | ICD-10-CM | POA: Diagnosis not present

## 2024-04-22 DIAGNOSIS — H43813 Vitreous degeneration, bilateral: Secondary | ICD-10-CM | POA: Diagnosis not present

## 2024-04-25 DIAGNOSIS — R2232 Localized swelling, mass and lump, left upper limb: Secondary | ICD-10-CM | POA: Diagnosis not present

## 2024-04-25 DIAGNOSIS — M25532 Pain in left wrist: Secondary | ICD-10-CM | POA: Diagnosis not present

## 2024-04-25 DIAGNOSIS — G5602 Carpal tunnel syndrome, left upper limb: Secondary | ICD-10-CM | POA: Diagnosis not present

## 2024-04-25 DIAGNOSIS — M779 Enthesopathy, unspecified: Secondary | ICD-10-CM | POA: Diagnosis not present

## 2024-05-03 ENCOUNTER — Ambulatory Visit: Payer: Self-pay | Admitting: Nurse Practitioner

## 2024-05-03 ENCOUNTER — Encounter: Payer: Self-pay | Admitting: Nurse Practitioner

## 2024-05-03 VITALS — BP 150/76 | HR 54 | Temp 97.2°F | Ht 64.0 in | Wt 147.0 lb

## 2024-05-03 DIAGNOSIS — E7800 Pure hypercholesterolemia, unspecified: Secondary | ICD-10-CM

## 2024-05-03 DIAGNOSIS — E039 Hypothyroidism, unspecified: Secondary | ICD-10-CM | POA: Diagnosis not present

## 2024-05-03 DIAGNOSIS — F5101 Primary insomnia: Secondary | ICD-10-CM

## 2024-05-03 DIAGNOSIS — I10 Essential (primary) hypertension: Secondary | ICD-10-CM

## 2024-05-03 DIAGNOSIS — D75839 Thrombocytosis, unspecified: Secondary | ICD-10-CM

## 2024-05-03 DIAGNOSIS — K219 Gastro-esophageal reflux disease without esophagitis: Secondary | ICD-10-CM

## 2024-05-03 DIAGNOSIS — Z6828 Body mass index (BMI) 28.0-28.9, adult: Secondary | ICD-10-CM

## 2024-05-03 MED ORDER — AMLODIPINE BESYLATE 5 MG PO TABS: 5.0000 mg | ORAL_TABLET | Freq: Every day | ORAL | 1 refills | Status: DC

## 2024-05-03 MED ORDER — FERROUS SULFATE 325 (65 FE) MG PO TBEC: 325.0000 mg | DELAYED_RELEASE_TABLET | Freq: Two times a day (BID) | ORAL | 1 refills | Status: AC

## 2024-05-03 MED ORDER — LEVOTHYROXINE SODIUM 100 MCG PO TABS: 100.0000 ug | ORAL_TABLET | Freq: Every day | ORAL | 1 refills | Status: DC

## 2024-05-03 MED ORDER — LISINOPRIL-HYDROCHLOROTHIAZIDE 20-12.5 MG PO TABS: 2.0000 | ORAL_TABLET | Freq: Every day | ORAL | 1 refills | Status: AC

## 2024-05-03 MED ORDER — OMEPRAZOLE 40 MG PO CPDR: 40.0000 mg | DELAYED_RELEASE_CAPSULE | Freq: Every day | ORAL | 1 refills | Status: DC

## 2024-05-03 MED ORDER — ATORVASTATIN CALCIUM 40 MG PO TABS: 40.0000 mg | ORAL_TABLET | Freq: Every day | ORAL | 1 refills | Status: DC

## 2024-05-03 NOTE — Progress Notes (Signed)
 Subjective:    Patient ID: Gabriela Gardner, female    DOB: 1950-07-10, 73 y.o.   MRN: [id number]   Chief Complaint: medical management of chronic issues     HPI:  Gabriela Gardner is a 73 y.o. who identifies as a female who was assigned female at birth.   Social history: Lives with: husband Work history: retired   Water Engineer in today for follow up of the following chronic medical issues:  1. Primary hypertension No c/o chest pain, sob or headache. Does not check blood pressure at home. BP Readings from Last 3 Encounters:  02/16/24 134/74  11/02/23 134/74  05/08/23 (!) 159/79     2. Pure hypercholesterolemia Does try to watch diet and stays very active Lab Results  Component Value Date   CHOL 170 11/02/2023   HDL 52 11/02/2023   LDLCALC 72 11/02/2023   TRIG 286 (H) 11/02/2023   CHOLHDL 3.3 11/02/2023    The 89-bzjm ASCVD risk score (Arnett DK, et al., 2019) is: 20.3%  3. Acquired hypothyroidism No issues  that she is aware of. Lab Results  Component Value Date   TSH 1.430 11/02/2023     4. Gastroesophageal reflux disease without esophagitis Omperazole works well for her on a daily basis.   5. Primary insomnia Better, she takes 2 extra strength tylenol  6. BMI 28.0-28.9,adult No recent weight changes Wt Readings from Last 3 Encounters:  02/16/24 153 lb (69.4 kg)  11/02/23 153 lb (69.4 kg)  05/08/23 159 lb (72.1 kg)   BMI Readings from Last 3 Encounters:  02/16/24 26.26 kg/m  11/02/23 26.26 kg/m  05/08/23 27.29 kg/m      New complaints: None today  Allergies  Allergen Reactions   Codeine Rash   Outpatient Encounter Medications as of 05/03/2024  Medication Sig   amLODipine  (NORVASC ) 5 MG tablet Take 1 tablet (5 mg total) by mouth daily.   Ascorbic Acid (VITAMIN C PO) Take by mouth.   atorvastatin  (LIPITOR) 40 MG tablet Take 1 tablet (40 mg total) by mouth daily.   calcium  carbonate (OS-CAL) 600 MG TABS Take 600 mg by mouth daily.   ferrous  sulfate 325 (65 FE) MG EC tablet Take 1 tablet (325 mg total) by mouth 2 (two) times daily before a meal.   levothyroxine  (SYNTHROID ) 100 MCG tablet Take 1 tablet (100 mcg total) by mouth daily.   lisinopril -hydrochlorothiazide  (ZESTORETIC ) 20-12.5 MG tablet Take 2 tablets by mouth daily.   meloxicam  (MOBIC ) 15 MG tablet Take 1 tablet (15 mg total) by mouth daily.   Multiple Vitamin (MULTIVITAMIN) tablet Take 1 tablet by mouth daily.   omeprazole  (PRILOSEC) 40 MG capsule Take 1 capsule (40 mg total) by mouth daily.   No facility-administered encounter medications on file as of 05/03/2024.    Past Surgical History:  Procedure Laterality Date   ABDOMINAL HYSTERECTOMY     SKIN LESION EXCISION      Family History  Adopted: Yes  Problem Relation Age of Onset   Turner syndrome Daughter    Asthma Son    Breast cancer Neg Hx       Controlled substance contract: n/a     Review of Systems  Constitutional:  Negative for diaphoresis.  Eyes:  Negative for pain.  Respiratory:  Negative for shortness of breath.   Cardiovascular:  Negative for chest pain, palpitations and leg swelling.  Gastrointestinal:  Negative for abdominal pain.  Endocrine: Negative for polydipsia.  Skin:  Negative for rash.  Neurological:  Negative  for dizziness, weakness and headaches.  Hematological:  Does not bruise/bleed easily.  All other systems reviewed and are negative.      Objective:   Physical Exam Vitals and nursing note reviewed.  Constitutional:      General: She is not in acute distress.    Appearance: Normal appearance. She is well-developed.  HENT:     Head: Normocephalic.     Right Ear: Tympanic membrane normal.     Left Ear: Tympanic membrane normal.     Nose: Nose normal.     Mouth/Throat:     Mouth: Mucous membranes are moist.  Eyes:     Pupils: Pupils are equal, round, and reactive to light.  Neck:     Vascular: No carotid bruit or JVD.  Cardiovascular:     Rate and Rhythm:  Normal rate and regular rhythm.     Heart sounds: Normal heart sounds.  Pulmonary:     Effort: Pulmonary effort is normal. No respiratory distress.     Breath sounds: Normal breath sounds. No wheezing or rales.  Chest:     Chest wall: No tenderness.  Abdominal:     General: Bowel sounds are normal. There is no distension or abdominal bruit.     Palpations: Abdomen is soft. There is no hepatomegaly, splenomegaly, mass or pulsatile mass.     Tenderness: There is no abdominal tenderness.  Musculoskeletal:        General: Normal range of motion.     Cervical back: Normal range of motion and neck supple.  Lymphadenopathy:     Cervical: No cervical adenopathy.  Skin:    General: Skin is warm and dry.  Neurological:     Mental Status: She is alert and oriented to person, place, and time.     Deep Tendon Reflexes: Reflexes are normal and symmetric.  Psychiatric:        Behavior: Behavior normal.        Thought Content: Thought content normal.        Judgment: Judgment normal.     BP (!) 150/76   Pulse (!) 54   Temp (!) 97.2 F (36.2 C) (Temporal)   Ht 5' 4 (1.626 m)   Wt 147 lb (66.7 kg)   SpO2 98%   BMI 25.23 kg/m         Assessment & Plan:  Gabriela Gardner comes in today with chief complaint of No chief complaint on file.   Diagnosis and orders addressed:  1. Primary hypertension Low sodium diet - amLODipine  (NORVASC ) 5 MG tablet; Take 1 tablet (5 mg total) by mouth daily.  Dispense: 90 tablet; Refill: 1 - lisinopril -hydrochlorothiazide  (ZESTORETIC ) 20-12.5 MG tablet; Take 2 tablets by mouth daily.  Dispense: 180 tablet; Refill: 1 - CBC with Differential/Platelet - CMP14+EGFR  2. Pure hypercholesterolemia Low fat diet - atorvastatin  (LIPITOR) 40 MG tablet; Take 1 tablet (40 mg total) by mouth daily.  Dispense: 90 tablet; Refill: 1 - Lipid panel  3. Acquired hypothyroidism - lab pending - levothyroxine  (SYNTHROID ) 100 MCG tablet; Take 1 tablet (100 mcg total) by  mouth daily.  Dispense: 90 tablet; Refill: 1 - Thyroid  Panel With TSH  4. Gastroesophageal reflux disease without esophagitis Avoid spicy foods Do not eat 2 hours prior to bedtime - omeprazole  (PRILOSEC) 40 MG capsule; Take 1 capsule (40 mg total) by mouth daily.  Dispense: 90 capsule; Refill: 1  5. Primary insomnia Bedtime routine  6. BMI 28.0-28.9,adult Discussed diet and exercise for person with BMI >  25 Will recheck weight in 3-6 months   7. Thrombocytosis Labs pending - ferrous sulfate  325 (65 FE) MG EC tablet; Take 1 tablet (325 mg total) by mouth 2 (two) times daily before a meal.  Dispense: 90 tablet; Refill: 1   Labs pending Health Maintenance reviewed Diet and exercise encouraged  Follow up plan: 6 months   Mary-Margaret Gladis, FNP

## 2024-05-04 LAB — CMP14+EGFR
ALT: 22 IU/L (ref 0–32)
AST: 28 IU/L (ref 0–40)
Albumin: 4.9 g/dL — ABNORMAL HIGH (ref 3.8–4.8)
Alkaline Phosphatase: 70 IU/L (ref 49–135)
BUN/Creatinine Ratio: 18 (ref 12–28)
BUN: 17 mg/dL (ref 8–27)
Bilirubin Total: 0.6 mg/dL (ref 0.0–1.2)
CO2: 21 mmol/L (ref 20–29)
Calcium: 10.4 mg/dL — ABNORMAL HIGH (ref 8.7–10.3)
Chloride: 98 mmol/L (ref 96–106)
Creatinine, Ser: 0.94 mg/dL (ref 0.57–1.00)
Globulin, Total: 3 g/dL (ref 1.5–4.5)
Glucose: 118 mg/dL — ABNORMAL HIGH (ref 70–99)
Potassium: 4.4 mmol/L (ref 3.5–5.2)
Sodium: 137 mmol/L (ref 134–144)
Total Protein: 7.9 g/dL (ref 6.0–8.5)
eGFR: 64 mL/min/1.73 (ref >59)

## 2024-05-04 LAB — THYROID PANEL WITH TSH
Free Thyroxine Index: 2.3 (ref 1.2–4.9)
T3 Uptake Ratio: 25 % (ref 24–39)
T4, Total: 9.1 ug/dL (ref 4.5–12.0)
TSH: 1.38 u[IU]/mL (ref 0.450–4.500)

## 2024-05-04 LAB — CBC WITH DIFFERENTIAL/PLATELET
Basophils Absolute: 0.1 x10E3/uL (ref 0.0–0.2)
Basos: 1 %
EOS (ABSOLUTE): 0.2 x10E3/uL (ref 0.0–0.4)
Eos: 2 %
Hematocrit: 43.7 % (ref 34.0–46.6)
Hemoglobin: 14.3 g/dL (ref 11.1–15.9)
Immature Grans (Abs): 0 x10E3/uL (ref 0.0–0.1)
Immature Granulocytes: 0 %
Lymphocytes Absolute: 1.2 x10E3/uL (ref 0.7–3.1)
Lymphs: 16 %
MCH: 31.4 pg (ref 26.6–33.0)
MCHC: 32.7 g/dL (ref 31.5–35.7)
MCV: 96 fL (ref 79–97)
Monocytes Absolute: 0.4 x10E3/uL (ref 0.1–0.9)
Monocytes: 6 %
Neutrophils Absolute: 5.5 x10E3/uL (ref 1.4–7.0)
Neutrophils: 75 %
Platelets: 299 x10E3/uL (ref 150–450)
RBC: 4.55 x10E6/uL (ref 3.77–5.28)
RDW: 11.8 % (ref 11.7–15.4)
WBC: 7.3 x10E3/uL (ref 3.4–10.8)

## 2024-05-04 LAB — LIPID PANEL
Chol/HDL Ratio: 3.6 ratio (ref 0.0–4.4)
Cholesterol, Total: 185 mg/dL (ref 100–199)
HDL: 52 mg/dL (ref >39)
LDL Chol Calc (NIH): 85 mg/dL (ref 0–99)
Triglycerides: 290 mg/dL — ABNORMAL HIGH (ref 0–149)
VLDL Cholesterol Cal: 48 mg/dL — ABNORMAL HIGH (ref 5–40)

## 2024-05-05 ENCOUNTER — Ambulatory Visit: Payer: Self-pay | Admitting: Nurse Practitioner

## 2024-05-06 DIAGNOSIS — M65842 Other synovitis and tenosynovitis, left hand: Secondary | ICD-10-CM | POA: Diagnosis not present

## 2024-05-06 DIAGNOSIS — G5602 Carpal tunnel syndrome, left upper limb: Secondary | ICD-10-CM | POA: Diagnosis not present

## 2024-05-06 DIAGNOSIS — M67834 Other specified disorders of tendon, left wrist: Secondary | ICD-10-CM | POA: Diagnosis not present

## 2024-05-06 DIAGNOSIS — M25532 Pain in left wrist: Secondary | ICD-10-CM | POA: Diagnosis not present

## 2024-05-06 DIAGNOSIS — Y999 Unspecified external cause status: Secondary | ICD-10-CM | POA: Diagnosis not present

## 2024-05-06 DIAGNOSIS — S66812A Strain of other specified muscles, fascia and tendons at wrist and hand level, left hand, initial encounter: Secondary | ICD-10-CM | POA: Diagnosis not present

## 2024-05-06 DIAGNOSIS — M67432 Ganglion, left wrist: Secondary | ICD-10-CM | POA: Diagnosis not present

## 2024-05-25 DIAGNOSIS — G8918 Other acute postprocedural pain: Secondary | ICD-10-CM | POA: Diagnosis not present

## 2024-05-25 DIAGNOSIS — G5601 Carpal tunnel syndrome, right upper limb: Secondary | ICD-10-CM | POA: Diagnosis not present

## 2024-06-06 DIAGNOSIS — M79602 Pain in left arm: Secondary | ICD-10-CM | POA: Diagnosis not present

## 2024-07-04 ENCOUNTER — Other Ambulatory Visit: Payer: Self-pay | Admitting: Nurse Practitioner

## 2024-07-04 DIAGNOSIS — M1990 Unspecified osteoarthritis, unspecified site: Secondary | ICD-10-CM

## 2024-07-04 DIAGNOSIS — E039 Hypothyroidism, unspecified: Secondary | ICD-10-CM

## 2024-07-04 NOTE — Telephone Encounter (Unsigned)
 Copied from CRM #[id number]. Topic: Clinical - Medication Refill >> Jul 04, 2024 10:27 AM Diannia H wrote: Medication:  meloxicam  (MOBIC ) 15 MG tablet, levothyroxine  (SYNTHROID ) 100 MCG tablet  Has the patient contacted their pharmacy? Yes (Agent: If no, request that the patient contact the pharmacy for the refill. If patient does not wish to contact the pharmacy document the reason why and proceed with request.) (Agent: If yes, when and what did the pharmacy advise?)  This is the patient's preferred pharmacy:  [facility] - [place], [place] - [address] AT Portal to Registered Caremark Sites [address] [place] [address] Phone: [phone] Fax: [phone]  Is this the correct pharmacy for this prescription? Yes If no, delete pharmacy and type the correct one.   Has the prescription been filled recently? No  Is the patient out of the medication? No  Has the patient been seen for an appointment in the last year OR does the patient have an upcoming appointment? Yes  Can we respond through MyChart? Yes  Agent: Please be advised that Rx refills may take up to 3 business days. We ask that you follow-up with your pharmacy.

## 2024-07-05 MED ORDER — LEVOTHYROXINE SODIUM 100 MCG PO TABS: 100.0000 ug | ORAL_TABLET | Freq: Every day | ORAL | 1 refills | Status: AC

## 2024-07-05 MED ORDER — MELOXICAM 15 MG PO TABS: 15.0000 mg | ORAL_TABLET | Freq: Every day | ORAL | 1 refills | Status: AC

## 2024-07-25 ENCOUNTER — Other Ambulatory Visit: Payer: Self-pay | Admitting: *Deleted

## 2024-07-25 DIAGNOSIS — E7800 Pure hypercholesterolemia, unspecified: Secondary | ICD-10-CM

## 2024-07-25 DIAGNOSIS — K219 Gastro-esophageal reflux disease without esophagitis: Secondary | ICD-10-CM

## 2024-07-25 DIAGNOSIS — I10 Essential (primary) hypertension: Secondary | ICD-10-CM

## 2024-07-25 MED ORDER — ATORVASTATIN CALCIUM 40 MG PO TABS: 40.0000 mg | ORAL_TABLET | Freq: Every day | ORAL | 0 refills | Status: AC

## 2024-07-25 MED ORDER — OMEPRAZOLE 40 MG PO CPDR: 40.0000 mg | DELAYED_RELEASE_CAPSULE | Freq: Every day | ORAL | 0 refills | Status: AC

## 2024-07-25 MED ORDER — AMLODIPINE BESYLATE 5 MG PO TABS: 5.0000 mg | ORAL_TABLET | Freq: Every day | ORAL | 0 refills | Status: AC

## 2024-07-25 NOTE — Telephone Encounter (Signed)
 Copied from CRM #[id number]. Topic: Clinical - Medication Refill >> Jul 25, 2024  2:00 PM Kevelyn M wrote: Medication: omeprazole  (PRILOSEC) 40 MG capsule ,amLODipine  (NORVASC ) 5 MG tablet, lisinopril -hydrochlorothiazide  (ZESTORETIC ) 20-12.5 MG tablet, atorvastatin  (LIPITOR) 40 MG tablet.   Has the patient contacted their pharmacy? No (Agent: If no, request that the patient contact the pharmacy for the refill. If patient does not wish to contact the pharmacy document the reason why and proceed with request.) (Agent: If yes, when and what did the pharmacy advise?)  This is the patient's preferred pharmacy:  [facility] - [place], [place] - [address] [facility] [address] AT Portal to Registered Caremark Sites [address] [place] [address] Phone: [phone] Fax: [phone]  Is this the correct pharmacy for this prescription? Yes If no, delete pharmacy and type the correct one.   Has the prescription been filled recently? No  Is the patient out of the medication? No  Has the patient been seen for an appointment in the last year OR does the patient have an upcoming appointment? Yes  Can we respond through MyChart? Yes  Agent: Please be advised that Rx refills may take up to 3 business days. We ask that you follow-up with your pharmacy.

## 2024-07-26 ENCOUNTER — Telehealth: Payer: Self-pay

## 2024-07-26 ENCOUNTER — Other Ambulatory Visit: Payer: Self-pay | Admitting: Nurse Practitioner

## 2024-07-26 NOTE — Telephone Encounter (Signed)
 Copied from CRM #[id number]. 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 Can respond through MyChart.

## 2024-07-27 MED ORDER — CELECOXIB 200 MG PO CAPS: 200.0000 mg | ORAL_CAPSULE | Freq: Two times a day (BID) | ORAL | 1 refills | Status: DC

## 2024-07-27 NOTE — Addendum Note (Signed)
 Addended by: Joanne Brander, MARY-MARGARET on: 07/27/2024 10:52 AM   Modules accepted: Orders

## 2024-07-27 NOTE — Telephone Encounter (Signed)
 Can try switching from mobic  to celebrex  BID. Prescription was sent to [facility] in [place]  Meds ordered this encounter  Medications   celecoxib  (CELEBREX ) 200 MG capsule    Sig: Take 1 capsule (200 mg total) by mouth 2 (two) times daily.    Dispense:  60 capsule    Refill:  1    Supervising Provider:   MARYANNE CHEW A [[id number]]   Mary-Margaret Gladis, FNP

## 2024-07-31 MED ORDER — CELECOXIB 200 MG PO CAPS: 200.0000 mg | ORAL_CAPSULE | Freq: Two times a day (BID) | ORAL | 1 refills | Status: DC

## 2024-07-31 NOTE — Addendum Note (Signed)
 Addended by: INA RAMP D on: 07/31/2024 03:38 PM   Modules accepted: Orders

## 2024-08-01 ENCOUNTER — Other Ambulatory Visit: Payer: Self-pay | Admitting: Nurse Practitioner

## 2024-09-22 ENCOUNTER — Ambulatory Visit: Admitting: Nurse Practitioner

## 2025-02-16 ENCOUNTER — Ambulatory Visit: Payer: Self-pay

## 2025-02-17 ENCOUNTER — Ambulatory Visit
